# Patient Record
Sex: Male | Born: 1955 | Race: White | Hispanic: No | Smoking: Never smoker
Health system: Southern US, Community
[De-identification: ages and names within clinical notes are randomized; demographics above are authoritative.]

## PROBLEM LIST (undated history)

## (undated) DIAGNOSIS — M1990 Unspecified osteoarthritis, unspecified site: Secondary | ICD-10-CM

## (undated) DIAGNOSIS — Z789 Other specified health status: Secondary | ICD-10-CM

## (undated) DIAGNOSIS — D751 Secondary polycythemia: Secondary | ICD-10-CM

## (undated) DIAGNOSIS — C801 Malignant (primary) neoplasm, unspecified: Secondary | ICD-10-CM

## (undated) HISTORY — PX: KNEE ARTHROSCOPY: SHX127

## (undated) HISTORY — PX: GREAT TOE ARTHRODESIS, INTERPHALANGEAL JOINT: SUR55

---

## 1986-05-04 HISTORY — PX: SHOULDER ARTHROSCOPY: SHX128

## 1995-05-05 HISTORY — PX: LUMBAR LAMINECTOMY: SHX95

## 2004-05-04 HISTORY — PX: APPENDECTOMY: SHX54

## 2004-06-23 ENCOUNTER — Observation Stay (HOSPITAL_COMMUNITY): Admission: EM | Admit: 2004-06-23 | Discharge: 2004-06-24 | Payer: Self-pay | Admitting: Emergency Medicine

## 2004-06-23 ENCOUNTER — Encounter (INDEPENDENT_AMBULATORY_CARE_PROVIDER_SITE_OTHER): Payer: Self-pay | Admitting: *Deleted

## 2006-05-04 HISTORY — PX: ABSCESS DRAINAGE: SHX1119

## 2006-07-29 ENCOUNTER — Ambulatory Visit (HOSPITAL_COMMUNITY): Admission: RE | Admit: 2006-07-29 | Discharge: 2006-07-29 | Payer: Self-pay | Admitting: Cardiology

## 2007-09-30 ENCOUNTER — Inpatient Hospital Stay (HOSPITAL_COMMUNITY): Admission: EM | Admit: 2007-09-30 | Discharge: 2007-10-02 | Payer: Self-pay | Admitting: Emergency Medicine

## 2008-09-23 ENCOUNTER — Encounter: Admission: RE | Admit: 2008-09-23 | Discharge: 2008-09-23 | Payer: Self-pay | Admitting: Sports Medicine

## 2010-08-21 ENCOUNTER — Institutional Professional Consult (permissible substitution): Payer: Self-pay | Admitting: Cardiology

## 2010-09-16 NOTE — H&P (Signed)
NAMEDENALI, BECVAR            ACCOUNT NO.:  [id number]   MEDICAL RECORD NO.:  [id number]          PATIENT TYPE:  INP   LOCATION:  1537                         FACILITY:  [facility]   PHYSICIAN:  Kari Baars, M.D.  DATE OF BIRTH:  July 30, 1955   DATE OF ADMISSION:  09/30/2007  DATE OF DISCHARGE:                              HISTORY & PHYSICAL   CHIEF COMPLAINT:  Right buttocks pain and redness.   HISTORY OF PRESENT ILLNESS:  Brad Davis is a 55 year old white male,  competitive bodybuilder on chronic testosterone injections, who  presented to the emergency department with complaint of right buttocks  pain and redness.  He reports that he gave his typical testosterone dose  IM on Sunday evening.  He typically takes this every other day for the  past multiple years.  The following day he noticed increased warmth and  tenderness over the injection site.  He went to Urgent Care on the next  day and was given Rocephin IM and Avelox.Marland Kitchen  He experienced no  improvement, so he returned to the Urgent Care today and was referred to  the emergency department for evaluation of right buttocks cellulitis.   CT was performed here and was negative for abscess.  He denies any  systemic symptoms including fevers, chills or sweats.  He has not had  any prior infections.  No known MRSA exposure or prior infection.  He  requires admission for IV antibiotics.   PAST MEDICAL HISTORY AND SURGICAL HISTORY:  1. Status post appendectomy (February 2006).  2. Status post left shoulder surgery.  3. Status post knee surgery.  4. Status post L4-S1 lumbar laminectomy.  5. Status post foot surgery.   CURRENT MEDICATIONS:  1. Testosterone propionate IM every other day for many years.  2. Deca 200 daily.  3. Trenbolone 300 mg daily.  4. A-50 daily.   ALLERGIES:  NO KNOWN DRUG ALLERGIES.   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  He was Mr.  [employer] in 2005.  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  No tobacco, alcohol or drug use.   FAMILY HISTORY:  Mother and father are both healthy.  He had a sister  with leukemia, who was in remission.   REVIEW OF SYSTEMS:  All systems reviewed with the patient and are  negative except as in HPI.   PHYSICAL EXAMINATION:  Temperature 98.8, blood pressure 133/72, pulse  72, respirations 18, oxygen saturation 95% on room air.  GENERAL:  Extremely muscular gentleman with thickened skin.  Overly tan.  HEENT:  Oropharynx was moist.  NECK:  Supple without lymphadenopathy, JVD or carotid bruits.  HEART:  Regular rate and rhythm without murmurs, rubs or gallops.  LUNGS: Clear to auscultation bilaterally.  ABDOMEN:  Soft, nondistended, nontender with normal active bowel sounds.  EXTREMITIES:  He has a right gluteal/iliac circumscribed area of warmth,  tenderness and erythema.  It is swollen, but there is no fluctuance  or  purulence.  Minimally tender.  No peripheral stigmata of endocarditis.   LABS:  CBC:  White count 8.2, hemoglobin 15.9, platelets 273.  BMET:  Sodium 140, potassium 3.7, chloride 102, BUN 34, creatinine 1.2, glucose  104.   CT of the pelvis shows a right lateral buttocks cellulitis and myositis  without abscess.   ASSESSMENT AND PLAN:  Right Buttocks Cellulitis:  This appears to be cellulitis resistant to  oral antibiotics.  Thus far he has failed outpatient treatment with  p.o. antibiotics.  He is at high risk for community acquired MRSA, due  to his gym him exposures and repeated IM injections.  He will be  admitted for IV antibiotics with vancomycin and Zosyn.  PICC line will  be placed for home IV antibiotics for 7-10 days.  He should be monitored  for at least 36-48 hours to make sure that his cellulitis is improving.  I have a fairly low suspicion for  complicated skin infection such as  necrotizing fasciitis, as his cellulitis has not significantly worsened  and is not exquisitely tender.  He also does not have any systemic  symptoms.  However, will monitor for any signs of progression.  I did  discuss with them the risk of recurrent infections with his injections,  and long-term issues related to testosterone treatment.  He is very  committed to his pursuit of the national title again, and is very  worried about this PICC line and antibiotics may delay his training.   DISPOSITION:  Anticipate discharge on Saturday, if he is improving, with  IV antibiotics through Home Health.  We will go ahead and arrange for  this pending his clinical improvement.      Kari Baars, M.D.  Electronically Signed     WS/MEDQ  D:  09/30/2007  T:  09/30/2007  Job:  [id number]   cc:   Gwen Pounds, MD  Fax: [phone]

## 2010-09-19 NOTE — Op Note (Signed)
NAMETARRY, FOUNTAIN            ACCOUNT NO.:  [id number]   MEDICAL RECORD NO.:  [id number]          PATIENT TYPE:  INP   LOCATION:  0105                         [facility]ACILITY:  [facility]   PHYSICIAN:  Thornton Park. Daphine Deutscher, MD  DATE OF BIRTH:  April 30, 1956   DATE OF PROCEDURE:  06/23/2004  DATE OF DISCHARGE:                                 OPERATIVE REPORT   PREOPERATIVE DIAGNOSIS:  Acute appendicitis.   POSTOPERATIVE DIAGNOSIS:  Acute perforated appendicitis with peritonitis.   SURGEON:  Thornton Park. Daphine Deutscher, MD   ANESTHESIA:  General endotracheal.   COMPLICATIONS:  None apparent.   DRAINS:  None.   ESTIMATED BLOOD LOSS:  25 cc.   DESCRIPTION OF PROCEDURE:  Patient was taken to room #1 and given general  anesthesia.  The abdomen was prepped widely with Betadine and draped  sterilely.  A midline longitudinal incision done on the umbilicus and  entered through a small umbilical hernia.  Upon doing that, I medially  encountered yellow, creamy pus, which was cultured for anaerobes and  aerobes.  A Hasson cannula was inserted, and a 5 mm port using the auto-  suture placed in the right upper quadrant.  A 10-11 in the left lower  quadrant was used.  Purulent peritonitis was noted throughout.  I then went  down and dissected a very stuck-in appendix, where it appeared to have  ruptured near the base; however, I was able to get around the base up stream  from the perforation near the base of the cecum, and then I used the auto-  suture stapler using a vascular load.  Stapled across the cecum to that  location.  The appendix was then elevated, and then the mesentery was then  transected with a harmonic scalpel.   This was then put in a bag and then brought out through the umbilicus.  Three liters of saline were then used to irrigate the abdomen, and most of  this was withdrawn.  I reinspected the appendiceal region, and again, no  bleeding was noted.  The stump was intact, and everything appeared  to be in  order.  Most all of the effluent was withdrawn.  The patient seemed to  tolerate the procedure well, despite the fact that this was perforated.  It  had been perforated for an indefinite period of time.  The umbilical port  was then repaired with a 0 Vicryl, simple, and a second suture that I had  placed at the beginning of the procedure, and these were tied down.  The  abdomen was deflated.  The wounds were closed with 4-0 Vicryl with Benzoin  and Steri-Strips.  The patient seemed to tolerate the procedure well and was  taken to the recovery room in satisfactory condition.    MBM/MEDQ  D:  06/23/2004  T:  06/23/2004  Job:  [id number]

## 2010-09-19 NOTE — H&P (Signed)
NAMESHAIL, URBAS NO.:  [id number]   MEDICAL RECORD NO.:  [id number]          PATIENT TYPE:  EMS   LOCATION:  ED                           FACILITY:  [facility]   PHYSICIAN:  Thornton Park. Daphine Deutscher, MD  DATE OF BIRTH:  04/23/1956   DATE OF ADMISSION:  06/23/2004  DATE OF DISCHARGE:                                HISTORY & PHYSICAL   CHIEF COMPLAINT:  Abdominal pain, appendicitis.   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  He was seen by Dr. Carleene Cooper and a CT scan was ordered which  demonstrated acute appendicitis.   PAST MEDICAL HISTORY:  The patient has no known allergies.  He denies any  medical problems.   SOCIAL HISTORY:  Denies drug use, alcohol use, is a nonsmoker and lives  alone.   REVIEW OF SYMPTOMS:  Negative in terms of asking him about antibiotic  steroids.  He denies any history of migraines, asthma, heart and lung  problems, GI problems, bleeding, peripheral to his extremities including  DVT.   PHYSICAL EXAMINATION:  GENERAL:  Reveals a very muscular man who is  obviously in some distress.  HEENT:  Unremarkable.  NECK:  Supple without adenopathy or bruits.  CHEST:  Clear.  HEART:  Sinus rhythm without murmurs or gallops.  ABDOMEN:  He is tender at McBurney's point with no generalized peritonitis.  EXTREMITIES:  Full range of motion.   LABORATORY DATA:  White count 17,600 with a hemoglobin of 16.7. Electrolytes  are all within normal limits except for a glucose of 109 and a BUN of 25.  Creatinine is 1.2.  Urinalysis is negative.   IMPRESSION:  Acute appendicitis. I saw the patient in the ED and described  laparoscopic as well as open appendectomy with him in some detail including  complications not limited to bleeding, abscess formation. HE is ready to go  ahead and have this done and we are in the process of giving him Unasyn and  he will be brought to the [facility] OR for a laparoscopic/open  appendectomy.      MBM/MEDQ  D:  06/23/2004  T:  06/23/2004  Job:  [id number]   cc:   [facility]

## 2011-01-28 LAB — BASIC METABOLIC PANEL
BUN: 33 — ABNORMAL HIGH
BUN: 36 — ABNORMAL HIGH
BUN: 40 — ABNORMAL HIGH
CO2: 28
CO2: 28
CO2: 30
Calcium: 9.3
Calcium: 9.4
Calcium: 9.8
Chloride: 102
Chloride: 102
Chloride: 102
Creatinine, Ser: 1
Creatinine, Ser: 1.09
Creatinine, Ser: 1.28
GFR calc non Af Amer: >60
GFR calc non Af Amer: >60
Glucose, Bld: 84
Glucose, Bld: 88
Glucose, Bld: 91
Potassium: 3.8
Potassium: 4.1
Sodium: 141

## 2011-01-28 LAB — CBC
HCT: 44.7
HCT: 47.1
Hemoglobin: 15.9
Hemoglobin: 16.3
MCHC: 35.4
MCV: 83
RBC: 5.37
RBC: 5.46
RBC: 5.68

## 2011-01-28 LAB — POCT I-STAT, CHEM 8
BUN: 34 — ABNORMAL HIGH
Calcium, Ion: 1.26
Creatinine, Ser: 1.2
Hemoglobin: 17
Potassium: 3.7
Sodium: 140
TCO2: 28

## 2011-01-28 LAB — DIFFERENTIAL
Basophils Relative: 0
Eosinophils Absolute: 0.1
Eosinophils Relative: 2
Lymphocytes Relative: 17
Lymphs Abs: 1.4

## 2011-01-28 LAB — CULTURE, BLOOD (ROUTINE X 2): Culture: NO GROWTH

## 2012-10-17 ENCOUNTER — Encounter (HOSPITAL_BASED_OUTPATIENT_CLINIC_OR_DEPARTMENT_OTHER): Payer: Self-pay | Admitting: *Deleted

## 2012-10-17 NOTE — Progress Notes (Signed)
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 Multiple ortho surgeries

## 2012-10-19 ENCOUNTER — Other Ambulatory Visit: Payer: Self-pay | Admitting: Orthopedic Surgery

## 2012-10-19 NOTE — H&P (Signed)
Brad Davis/[facility] [address]   [address] [place], [place] [address] [phone] A Division of [facility]  Brad Davis, M.D.   Brad Davis Hole, M.D.   Brad Davis, M.D.   Brad Davis, M.D.   Brad Davis, M.D Brad Davis, M.D.  Brad Davis, M.D.   Brad Davis, M.D.   Brad Davis, M.D. Brad Davis. Brad Dienes, PA-C            Brad A. 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 On May 26th he was doing light weight curls, 30 pounds, and felt a popping sensation in his proximal biceps. He notes that he has since developed shoulder pain with any kind of elbow flexion. He denies any radiating pain, weakness or numbness. He does note he has developed ecchymosis in his anterior biceps. He has never had an injury like this before and feels well otherwise. He is able to continue working. Past Medical History:   Otherwise significant for a left shoulder distal clavicle excision performed about 20 years ago. Family History:   Coronary artery disease. Social History:   Nonsmoker. Self-employed. Pharmacist, community. Review of Systems:   See HPI. No allergies.    EXAM: Height 5'9", weight 225 pounds. Blood pressure 120/79. Extremely muscular appearing man in no acute distress. Left shoulder:  well developed musculature. Mildly tender over the bicipital groove. Nontender over the muscle belly of the biceps tendon itself. Pain with resisted biceps flexion. No Popeye sign compared to the contralateral side. Full shoulder range of motion with negative impingement testing. Capillary refill and sensation are intact distally. Further exam is normal and documented in the paper chart.    X-RAYS: A 3-view shoulder shows evidence of an old distal clavicle excision with  massively hypertrophied distal clavicle bone. No DJD present. Limited musculoskeletal ultrasound of the left shoulder technically limited due to extreme musculature. The biceps tendon groove is intact. There appears to be tendinosis structures seen within the groove; however, this is somewhat distorted. Possible partial thickness tear. Linear fibers seen on longitudinal view. Doubtful for full thickness tear. Likely partial biceps tendon tear.   IMPRESSION: Left shoulder probable partial proximal biceps tendon tear.   PLAN: Discussed options. Patient is not able to complete his normal activities because of shoulder pain. He has pain with elbow flexion. Even light weight lifting such as would be normal for activities of daily living is uncomfortable. He likely has a partial biceps tendon tear; however, this is a limited study based on his body habitus. We will obtain an MRI and patient will follow up after the MRI to discuss options and what he can and can't do. Patient expresses understanding and agreement.     Brad Davis, M.D. Electronically verified by Brad Davis, M.D. DFM(EC):tal D 10-04-12, T 10-05-12 CC:  Dr. Creola Davis (fax [phone])  Brad Davis/[facility] [address]   [address] [place], [place] [address] [phone] A Division of [facility]  Brad Davis, M.D.   Brad Davis Hole, M.D.   Brad Davis, M.D.   Brad Davis, M.D.   Brad Davis, M.D Brad Davis, M.D.  Brad Davis, M.D.   Brad Davis, M.D.   Brad Davis, M.D.  Brad Davis. Brad Dienes, PA-C            Brad A. Shepperson, PA-C Brad Mershon, PA-C [place], [place]   RE: Brad, Davis                                [id number]      DOB: 1955/11/28 PROGRESS NOTE: 10-07-12 Brad Davis comes in for follow up.  MRI complete and I have reviewed the report, as well as the scan itself and discussed it with him at length.  He has had some  improvement of his distal symptoms over the biceps muscle, but continues to have significant dysfunction within the shoulder itself.  MRI shows severe tendinopathy long head biceps tendon intraarticularly with longitudinal split tearing.  There is also edema and injury at the musculotendinous junction from the biceps.  That is the area that is improving clinically.  Rotator cuff is intact.  Some mild deformity posterior labrum and posterior glenohumeral joint, not too extreme.  Previous AC decompression with marked hypertrophy and bony overgrowth there and despite how large that looks on x-ray and MRI he is really not having a lot of symptoms there.  The partially encapsulated collection within the posterior deltoid is consistent with intramuscular injections.    DISPOSITION:  I had a long thorough discussion with Brad Davis, 25 minutes face-to-face, concerning the situation.  I have given him the option of further time to see if we get enough improvement of his symptoms letting the musculotendinous injury heal.  Even after that however I think he is going to have continued symptoms from the proximal biceps changes.  The impact he is having on his shoulder with his weightlifting is evident and in the future is going to have an impact not only on his shoulder, but numerous body areas.  He is accepting that impact and there is nothing I am going to do that is going to change that.  His preference now is to try to address the biceps tendon, as he knows that is going to continue to be a problem.  We have discussed exam under anesthesia, arthroscopy with release and excision of intraarticular portion of biceps tendon.  I am going to allow this to recess into the bicipital groove.  We have talked about the pros and cons of extraarticular tenodesis and we are not going to do that, which is my preference.  We will look subacromially adding any further decompression, distal clavicle excision, as indicated based on findings.  I  am not anticipating having to do anything with his cuff, but I will do an acromioplasty.  I told him after I release the biceps a good six weeks of rest, although he can come out of his sling in a couple of days.  I have told him if he gets too aggressive too early he is going to pull the tendon out and end up with more of a cosmetic deformity.  All questions answered.  Procedure, risks, benefits and complications reviewed in detail.  I will see him at the time of operative intervention.    Brad Davis, M.D.   Electronically verified by Brad Davis, M.D. DFM:jjh D 10-07-12 T 10-10-12

## 2012-10-20 ENCOUNTER — Encounter (HOSPITAL_BASED_OUTPATIENT_CLINIC_OR_DEPARTMENT_OTHER): Payer: Self-pay | Admitting: Certified Registered"

## 2012-10-20 ENCOUNTER — Ambulatory Visit (HOSPITAL_BASED_OUTPATIENT_CLINIC_OR_DEPARTMENT_OTHER): Payer: Managed Care, Other (non HMO) | Admitting: Certified Registered"

## 2012-10-20 ENCOUNTER — Encounter (HOSPITAL_BASED_OUTPATIENT_CLINIC_OR_DEPARTMENT_OTHER): Payer: Self-pay

## 2012-10-20 ENCOUNTER — Ambulatory Visit (HOSPITAL_BASED_OUTPATIENT_CLINIC_OR_DEPARTMENT_OTHER)
Admission: RE | Admit: 2012-10-20 | Discharge: 2012-10-20 | Disposition: A | Discharge status: Home / Self Care | Payer: Managed Care, Other (non HMO) | Source: Ambulatory Visit | Attending: Orthopedic Surgery | Admitting: Orthopedic Surgery

## 2012-10-20 ENCOUNTER — Encounter (HOSPITAL_BASED_OUTPATIENT_CLINIC_OR_DEPARTMENT_OTHER)
Admission: RE | Disposition: A | Discharge status: Home / Self Care | Payer: Self-pay | Source: Ambulatory Visit | Attending: Orthopedic Surgery

## 2012-10-20 DIAGNOSIS — M948X9 Other specified disorders of cartilage, unspecified sites: Secondary | ICD-10-CM | POA: Insufficient documentation

## 2012-10-20 DIAGNOSIS — M67919 Unspecified disorder of synovium and tendon, unspecified shoulder: Secondary | ICD-10-CM | POA: Insufficient documentation

## 2012-10-20 DIAGNOSIS — M719 Bursopathy, unspecified: Secondary | ICD-10-CM | POA: Insufficient documentation

## 2012-10-20 DIAGNOSIS — M19019 Primary osteoarthritis, unspecified shoulder: Secondary | ICD-10-CM | POA: Insufficient documentation

## 2012-10-20 HISTORY — DX: Other specified health status: Z78.9

## 2012-10-20 HISTORY — PX: SHOULDER ARTHROSCOPY WITH ROTATOR CUFF REPAIR AND SUBACROMIAL DECOMPRESSION: SHX5686

## 2012-10-20 HISTORY — DX: Unspecified osteoarthritis, unspecified site: M19.90

## 2012-10-20 SURGERY — SHOULDER ARTHROSCOPY WITH ROTATOR CUFF REPAIR AND SUBACROMIAL DECOMPRESSION
Anesthesia: General | Site: Shoulder | Laterality: Left | Wound class: Clean

## 2012-10-20 MED ORDER — LACTATED RINGERS IV SOLN
INTRAVENOUS | Status: DC
  Administered 2012-10-20: 13:00:00 via INTRAVENOUS

## 2012-10-20 MED ORDER — OXYCODONE HCL 5 MG/5ML PO SOLN: 5.0000 mg | Freq: Once | ORAL | Status: DC | PRN

## 2012-10-20 MED ORDER — METOCLOPRAMIDE HCL 5 MG PO TABS: 5.0000 mg | ORAL_TABLET | Freq: Three times a day (TID) | ORAL | Status: DC | PRN

## 2012-10-20 MED ORDER — OXYCODONE-ACETAMINOPHEN 5-325 MG PO TABS: 1.0000 | ORAL_TABLET | ORAL | Status: DC | PRN

## 2012-10-20 MED ORDER — SODIUM CHLORIDE 0.9 % IR SOLN
Status: DC | PRN
  Administered 2012-10-20: 3000 mL

## 2012-10-20 MED ORDER — BUPIVACAINE-EPINEPHRINE PF 0.5-1:200000 % IJ SOLN
INTRAMUSCULAR | Status: DC | PRN
  Administered 2012-10-20: 30 mL

## 2012-10-20 MED ORDER — HYDROMORPHONE HCL PF 1 MG/ML IJ SOLN: 0.2500 mg | INTRAMUSCULAR | Status: DC | PRN

## 2012-10-20 MED ORDER — FENTANYL CITRATE 0.05 MG/ML IJ SOLN
50.0000 ug | INTRAMUSCULAR | Status: DC | PRN
  Administered 2012-10-20: 100 ug via INTRAVENOUS

## 2012-10-20 MED ORDER — OXYCODONE HCL 5 MG PO TABS: 5.0000 mg | ORAL_TABLET | Freq: Once | ORAL | Status: DC | PRN

## 2012-10-20 MED ORDER — METOCLOPRAMIDE HCL 5 MG/ML IJ SOLN: 5.0000 mg | Freq: Three times a day (TID) | INTRAMUSCULAR | Status: DC | PRN

## 2012-10-20 MED ORDER — ONDANSETRON HCL 4 MG PO TABS: 4.0000 mg | ORAL_TABLET | Freq: Four times a day (QID) | ORAL | Status: DC | PRN

## 2012-10-20 MED ORDER — LACTATED RINGERS IV SOLN
INTRAVENOUS | Status: DC
  Administered 2012-10-20 (×2): via INTRAVENOUS

## 2012-10-20 MED ORDER — ONDANSETRON HCL 4 MG/2ML IJ SOLN: 4.0000 mg | Freq: Four times a day (QID) | INTRAMUSCULAR | Status: DC | PRN

## 2012-10-20 MED ORDER — CEFAZOLIN SODIUM-DEXTROSE 2-3 GM-% IV SOLR: 2.0000 g | INTRAVENOUS | Status: DC

## 2012-10-20 MED ORDER — CHLORHEXIDINE GLUCONATE 4 % EX LIQD: 60.0000 mL | Freq: Once | CUTANEOUS | Status: DC

## 2012-10-20 MED ORDER — LIDOCAINE HCL (CARDIAC) 20 MG/ML IV SOLN
INTRAVENOUS | Status: DC | PRN
  Administered 2012-10-20: 40 mg via INTRAVENOUS

## 2012-10-20 MED ORDER — PROPOFOL 10 MG/ML IV BOLUS
INTRAVENOUS | Status: DC | PRN
  Administered 2012-10-20: 300 mg via INTRAVENOUS

## 2012-10-20 MED ORDER — SUCCINYLCHOLINE CHLORIDE 20 MG/ML IJ SOLN
INTRAMUSCULAR | Status: DC | PRN
  Administered 2012-10-20: 100 mg via INTRAVENOUS

## 2012-10-20 MED ORDER — ONDANSETRON HCL 4 MG/2ML IJ SOLN
INTRAMUSCULAR | Status: DC | PRN
  Administered 2012-10-20: 4 mg via INTRAVENOUS

## 2012-10-20 MED ORDER — MIDAZOLAM HCL 2 MG/2ML IJ SOLN
1.0000 mg | INTRAMUSCULAR | Status: DC | PRN
  Administered 2012-10-20: 4 mg via INTRAVENOUS

## 2012-10-20 SURGICAL SUPPLY — 72 items
APL SKNCLS STERI-STRIP NONHPOA (GAUZE/BANDAGES/DRESSINGS)
BENZOIN TINCTURE PRP APPL 2/3 (GAUZE/BANDAGES/DRESSINGS) IMPLANT
BLADE CUTTER GATOR 3.5 (BLADE) ×2 IMPLANT
BLADE CUTTER MENIS 5.5 (BLADE) IMPLANT
BLADE GREAT WHITE 4.2 (BLADE) ×2 IMPLANT
BLADE SURG 15 STRL LF DISP TIS (BLADE) IMPLANT
BLADE SURG 15 STRL SS (BLADE)
BUR OVAL 6.0 (BURR) ×2 IMPLANT
CANISTER OMNI JUG 16 LITER (MISCELLANEOUS) ×2 IMPLANT
CANISTER SUCTION 2500CC (MISCELLANEOUS) IMPLANT
CANNULA DRY DOC 8X75 (CANNULA) IMPLANT
CANNULA TWIST IN 8.25X7CM (CANNULA) IMPLANT
CLOTH BEACON ORANGE TIMEOUT ST (SAFETY) ×2 IMPLANT
DECANTER SPIKE VIAL GLASS SM (MISCELLANEOUS) IMPLANT
DRAPE OEC MINIVIEW 54X84 (DRAPES) IMPLANT
DRAPE STERI 35X30 U-POUCH (DRAPES) ×2 IMPLANT
DRAPE U-SHAPE 47X51 STRL (DRAPES) ×2 IMPLANT
DRAPE U-SHAPE 76X120 STRL (DRAPES) ×4 IMPLANT
DRSG PAD ABDOMINAL 8X10 ST (GAUZE/BANDAGES/DRESSINGS) ×2 IMPLANT
DURAPREP 26ML APPLICATOR (WOUND CARE) ×2 IMPLANT
ELECT MENISCUS 165MM 90D (ELECTRODE) ×2 IMPLANT
ELECT NDL TIP 2.8 STRL (NEEDLE) IMPLANT
ELECT NEEDLE TIP 2.8 STRL (NEEDLE) IMPLANT
ELECT REM PT RETURN 9FT ADLT (ELECTROSURGICAL) ×2
ELECTRODE REM PT RTRN 9FT ADLT (ELECTROSURGICAL) ×1 IMPLANT
GAUZE XEROFORM 1X8 LF (GAUZE/BANDAGES/DRESSINGS) ×2 IMPLANT
GLOVE BIO SURGEON STRL SZ 6.5 (GLOVE) ×1 IMPLANT
GLOVE BIOGEL PI IND STRL 7.0 (GLOVE) IMPLANT
GLOVE BIOGEL PI INDICATOR 7.0 (GLOVE) ×1
GLOVE ORTHO TXT STRL SZ7.5 (GLOVE) ×2 IMPLANT
GOWN PREVENTION PLUS XLARGE (GOWN DISPOSABLE) ×2 IMPLANT
GOWN STRL REIN 2XL XLG LVL4 (GOWN DISPOSABLE) ×3 IMPLANT
IV NS IRRIG 3000ML ARTHROMATIC (IV SOLUTION) ×8 IMPLANT
NDL SCORPION MULTI FIRE (NEEDLE) IMPLANT
NDL SUT 6 .5 CRC .975X.05 MAYO (NEEDLE) IMPLANT
NEEDLE MAYO TAPER (NEEDLE)
NEEDLE SCORPION MULTI FIRE (NEEDLE) IMPLANT
NS IRRIG 1000ML POUR BTL (IV SOLUTION) IMPLANT
PACK ARTHROSCOPY DSU (CUSTOM PROCEDURE TRAY) ×2 IMPLANT
PACK BASIN DAY SURGERY FS (CUSTOM PROCEDURE TRAY) ×2 IMPLANT
PASSER SUT SWANSON 36MM LOOP (INSTRUMENTS) IMPLANT
PENCIL BUTTON HOLSTER BLD 10FT (ELECTRODE) ×2 IMPLANT
SET ARTHROSCOPY TUBING (MISCELLANEOUS) ×2
SET ARTHROSCOPY TUBING LN (MISCELLANEOUS) ×1 IMPLANT
SLEEVE SCD COMPRESS KNEE MED (MISCELLANEOUS) ×1 IMPLANT
SLING ARM FOAM STRAP LRG (SOFTGOODS) ×1 IMPLANT
SLING ARM FOAM STRAP MED (SOFTGOODS) IMPLANT
SLING ARM FOAM STRAP XLG (SOFTGOODS) IMPLANT
SLING ARM IMMOBILIZER LRG (SOFTGOODS) IMPLANT
SLING ARM IMMOBILIZER MED (SOFTGOODS) IMPLANT
SPONGE GAUZE 4X4 12PLY (GAUZE/BANDAGES/DRESSINGS) ×4 IMPLANT
SPONGE LAP 4X18 X RAY DECT (DISPOSABLE) IMPLANT
STRIP CLOSURE SKIN 1/2X4 (GAUZE/BANDAGES/DRESSINGS) IMPLANT
SUCTION FRAZIER TIP 10 FR DISP (SUCTIONS) IMPLANT
SUT ETHIBOND 2 OS 4 DA (SUTURE) IMPLANT
SUT ETHILON 2 0 FS 18 (SUTURE) IMPLANT
SUT ETHILON 3 0 PS 1 (SUTURE) ×1 IMPLANT
SUT FIBERWIRE #2 38 T-5 BLUE (SUTURE)
SUT RETRIEVER MED (INSTRUMENTS) IMPLANT
SUT STEEL 4 (SUTURE) IMPLANT
SUT STEEL 5 (SUTURE) IMPLANT
SUT TIGER TAPE 7 IN WHITE (SUTURE) IMPLANT
SUT VIC AB 0 CT1 27 (SUTURE)
SUT VIC AB 0 CT1 27XBRD ANBCTR (SUTURE) IMPLANT
SUT VIC AB 2-0 SH 27 (SUTURE)
SUT VIC AB 2-0 SH 27XBRD (SUTURE) IMPLANT
SUT VIC AB 3-0 FS2 27 (SUTURE) IMPLANT
SUTURE FIBERWR #2 38 T-5 BLUE (SUTURE) IMPLANT
TAPE FIBER 2MM 7IN #2 BLUE (SUTURE) IMPLANT
TOWEL OR 17X24 6PK STRL BLUE (TOWEL DISPOSABLE) ×2 IMPLANT
WATER STERILE IRR 1000ML POUR (IV SOLUTION) ×2 IMPLANT
YANKAUER SUCT BULB TIP NO VENT (SUCTIONS) IMPLANT

## 2012-10-20 NOTE — Transfer of Care (Signed)
Immediate Anesthesia Transfer of Care Note  Patient: Chima Astorino Geddis  Procedure(s) Performed: Procedure(s): LEFT SHOULDER ARTHROSCOPY , DISTAL CLAVICULECTOMY, EXTENSIVE DEBRIDEMENT, WITH SUBACROMIAL DECOMPRESSION, DEBRIDE LABRUM ROTATOR CUFF (Left)  Patient Location: PACU  Anesthesia Type:GA combined with regional for post-op pain  Level of Consciousness: sedated  Airway & Oxygen Therapy: Patient Spontanous Breathing and Patient connected to face mask oxygen  Post-op Assessment: Report given to PACU RN and Post -op Vital signs reviewed and stable  Post vital signs: Reviewed and stable  Complications: No apparent anesthesia complications

## 2012-10-20 NOTE — Anesthesia Preprocedure Evaluation (Signed)
Anesthesia Evaluation  Patient identified by MRN, date of birth, ID band Patient awake    Reviewed: Allergy & Precautions, H&P , NPO status , Patient's Chart, lab work & pertinent test results  Airway Mallampati: I TM Distance: >3 FB Neck ROM: full    Dental  (+) Teeth Intact and Dental Advidsory Given   Pulmonary neg pulmonary ROS,    Pulmonary exam normal       Cardiovascular negative cardio ROS      Neuro/Psych negative neurological ROS     GI/Hepatic negative GI ROS, Neg liver ROS,   Endo/Other  negative endocrine ROS  Renal/GU negative Renal ROS  negative genitourinary   Musculoskeletal   Abdominal Normal abdominal exam  (+)   Peds  Hematology negative hematology ROS (+)   Anesthesia Other Findings   Reproductive/Obstetrics negative OB ROS                           Anesthesia Physical Anesthesia Plan  ASA: I  Anesthesia Plan: General   Post-op Pain Management:    Induction: Intravenous  Airway Management Planned: Oral ETT  Additional Equipment:   Intra-op Plan:   Post-operative Plan: Extubation in OR  Informed Consent: I have reviewed the patients History and Physical, chart, labs and discussed the procedure including the risks, benefits and alternatives for the proposed anesthesia with the patient or authorized representative who has indicated his/her understanding and acceptance.   Dental advisory given  Plan Discussed with: CRNA, Anesthesiologist and Surgeon  Anesthesia Plan Comments:         Anesthesia Quick Evaluation

## 2012-10-20 NOTE — Progress Notes (Signed)
Assisted Dr. Fitzgerald with left, ultrasound guided, interscalene  block. Side rails up, monitors on throughout procedure. See vital signs in flow sheet. Tolerated Procedure well. 

## 2012-10-20 NOTE — Anesthesia Postprocedure Evaluation (Signed)
Anesthesia Post Note  Patient: Brad Davis  Procedure(s) Performed: Procedure(s) (LRB): LEFT SHOULDER ARTHROSCOPY , DISTAL CLAVICULECTOMY, EXTENSIVE DEBRIDEMENT, WITH SUBACROMIAL DECOMPRESSION, DEBRIDE LABRUM ROTATOR CUFF (Left)  Anesthesia type: General  Patient location: PACU  Post pain: Pain level controlled and Adequate analgesia  Post assessment: Post-op Vital signs reviewed, Patient's Cardiovascular Status Stable, Respiratory Function Stable, Patent Airway and Pain level controlled  Last Vitals:  Filed Vitals:   10/20/12 1500  BP: 119/73  Pulse: 101  Temp:   Resp: 25    Post vital signs: Reviewed and stable  Level of consciousness: awake, alert  and oriented  Complications: No apparent anesthesia complications

## 2012-10-20 NOTE — Anesthesia Procedure Notes (Addendum)
Anesthesia Regional Block:  Interscalene brachial plexus block  Pre-Anesthetic Checklist: ,, timeout performed, Correct Patient, Correct Site, Correct Laterality, Correct Procedure, Correct Position, site marked, Risks and benefits discussed, pre-op evaluation,  At surgeon's request and post-op pain management  Laterality: Left  Prep: Maximum Sterile Barrier Precautions used and chloraprep       Needles:  Injection technique: Single-shot  Needle Type: Echogenic Stimulator Needle     Needle Length: 5cm 5 cm Needle Gauge: 22 and 22 G    Additional Needles:  Procedures: ultrasound guided (picture in chart) and nerve stimulator Interscalene brachial plexus block  Nerve Stimulator or Paresthesia:  Response: Biceps response, 0.4 mA,   Additional Responses:   Narrative:  Start time: 10/20/2012 12:58 PM End time: 10/20/2012 1:09 PM Injection made incrementally with aspirations every 5 mL. Anesthesiologist: Sampson Goon, MD  Additional Notes: 2% Lidocaine skin wheel.   Interscalene brachial plexus block Procedure Name: Intubation Performed by: Lance Coon Pre-anesthesia Checklist: Patient identified, Emergency Drugs available, Suction available and Patient being monitored Patient Re-evaluated:Patient Re-evaluated prior to inductionOxygen Delivery Method: Circle System Utilized Preoxygenation: Pre-oxygenation with 100% oxygen Intubation Type: IV induction Ventilation: Mask ventilation without difficulty Laryngoscope Size: Mac and 3 Grade View: Grade I Tube type: Oral Tube size: 8.0 mm Number of attempts: 1 Airway Equipment and Method: stylet and oral airway Placement Confirmation: ETT inserted through vocal cords under direct vision,  positive ETCO2 and breath sounds checked- equal and bilateral Tube secured with: Tape Dental Injury: Teeth and Oropharynx as per pre-operative assessment

## 2012-10-20 NOTE — Interval H&P Note (Signed)
History and Physical Interval Note:  10/20/2012 7:31 AM  Brad Davis  has presented today for surgery, with the diagnosis of LEFT SHOULDER DEGENERATIVE ARTHRITIS, DISORDERS OF BURSAE AND TENDONS IN SHOULDER REGION/ROTATOR CUFF  The various methods of treatment have been discussed with the patient and family. After consideration of risks, benefits and other options for treatment, the patient has consented to  Procedure(s): LEFT SHOULDER ARTHROSCOPY , DISTAL CLAVICULECTOMY, EXTENSIVE DEBRIDEMENT, WITH SUBACROMIAL DECOMPRESSION, BICEP TENODESIS (Left) as a surgical intervention .  The patient's history has been reviewed, patient examined, no change in status, stable for surgery.  I have reviewed the patient's chart and labs.  Questions were answered to the patient's satisfaction.     MURPHY,DANIEL F

## 2012-10-21 NOTE — Op Note (Signed)
NAMEMarland Kitchen  ORIS, STAFFIERI           ACCOUNT NO.:  [id number]  MEDICAL RECORD NO.:  [id number]  LOCATION:                               FACILITY:  [facility]  PHYSICIAN:  Loreta Ave, M.D. DATE OF BIRTH:  06-Jun-1955  DATE OF PROCEDURE:  10/20/2012 DATE OF DISCHARGE:  10/20/2012                              OPERATIVE REPORT   PREOPERATIVE DIAGNOSES:  Left shoulder tendinosis, tearing of long head biceps tendon.  Subacromial impingement.  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  POSTOPERATIVE DIAGNOSES:  Left shoulder impingement, partial tearing of rotator cuff above and below, not full-thickness.  Superior labrum tear. Intact biceps tendon.  Bony overgrowth of acromioclavicular joint.  PROCEDURES:  Left shoulder exam under anesthesia, arthroscopy. Debridement of labrum and assessment of biceps tendon.  Debridement of rotator cuff above and below.  Bursectomy, acromioplasty, CA ligament release.  Excision of distal clavicle.  SURGEON:  Loreta Ave, M.D.  ASSISTANT:  Janace Litten, OPA, present throughout the entire case and necessary for timely completion of procedure.  ANESTHESIA:  General.  BLOOD LOSS:  Minimal.  SPECIMENS:  None.  CULTURES:  None.  COMPLICATIONS:  None.  DRESSINGS:  Soft compressive sling.  PROCEDURE:  The patient was brought to the operating room and placed on the operating table in supine position.  After adequate anesthesia had been obtained, shoulder was examined.  Full motion, stable shoulder. Placed in beach-chair position on the shoulder positioner, prepped and draped in usual sterile fashion.  Three portals; anterior, posterior and lateral.  Arthroscope was introduced, shoulder was distended and inspected.  Surprisingly the biceps tendon looked excellent.  Good attachment to the glenoid and excellent tendon all the way through the bicipital groove.  No tearing,  no split tears, no degeneration to warren excision.  The labrum at the top debrided.  Articular cartilage looked good except for little indentation at the top of the humeral head.  Some thinning of the cuff supraspinatus about 1 cm medial to the attachment, but nothing full thickness.  Cannulas were redirected subacromially. Marked impingement and reactive bursitis.  Abrasive changes on the top of the cuff.  Bursa resected.  Cuff debrided.  Then thoroughly inspected.  Acromioplasty to a type 1 acromion releasing the CA ligament.  I then re-excised the distal clavicle and spurring in that area to create a nice interval and prevent impingement from the end of the clavicle.  Adequacy of decompression and debridement confirmed viewing from all portals.  Instruments and fluid were removed.  Portals were closed with nylon.  Sterile compressive dressing was applied. Anesthesia was reversed.  Brought to the recovery room.  Tolerated the surgery well.  No complications.     Loreta Ave, M.D.     DFM/MEDQ  D:  10/20/2012  T:  10/21/2012  Job:  [id number]

## 2012-10-24 ENCOUNTER — Encounter (HOSPITAL_BASED_OUTPATIENT_CLINIC_OR_DEPARTMENT_OTHER): Payer: Self-pay | Admitting: Orthopedic Surgery

## 2013-01-04 ENCOUNTER — Telehealth: Payer: Self-pay | Admitting: Medical Oncology

## 2013-01-04 NOTE — Telephone Encounter (Signed)
Dr Isabel Caprice sent [facility] to [facility] to have a unit of blood removed and they will not do it. Dr Isabel Caprice told him to call us. I left message for pt to return my call .  I called Dr Noralee Chars office and told RN to call [facility].

## 2013-01-09 ENCOUNTER — Other Ambulatory Visit: Payer: Self-pay | Admitting: Urology

## 2013-01-09 DIAGNOSIS — D751 Secondary polycythemia: Secondary | ICD-10-CM

## 2013-01-16 ENCOUNTER — Other Ambulatory Visit (HOSPITAL_COMMUNITY): Payer: Self-pay | Admitting: Urology

## 2013-01-16 ENCOUNTER — Ambulatory Visit (HOSPITAL_COMMUNITY)
Admission: RE | Admit: 2013-01-16 | Discharge: 2013-01-16 | Disposition: A | Discharge status: Home / Self Care | Payer: Managed Care, Other (non HMO) | Source: Ambulatory Visit | Attending: Urology | Admitting: Urology

## 2013-01-16 ENCOUNTER — Encounter (HOSPITAL_COMMUNITY): Payer: Self-pay

## 2013-01-16 DIAGNOSIS — D751 Secondary polycythemia: Secondary | ICD-10-CM | POA: Insufficient documentation

## 2013-01-16 NOTE — Procedures (Signed)
Therapeutic phlebotomy started- 1112, end-1135 . of blood withdrawn from patient.

## 2013-01-16 NOTE — Discharge Instructions (Signed)
Therapeutic Phlebotomy Therapeutic phlebotomy is the controlled removal of blood from your body for the purpose of treating a medical condition. It is similar to donating blood. Usually, about a pint (470 mL) of blood is removed. The average adult has 9 to 12 pints (4.3 to 5.7 L) of blood. Therapeutic phlebotomy may be used to treat the following medical conditions:  Hemochromatosis. This is a condition in which there is too much iron in the blood.  Polycythemia vera. This is a condition in which there are too many red cells in the blood.  Porphyria cutanea tarda. This is a disease usually passed from one generation to the next (inherited). It is a condition in which an important part of hemoglobin is not made properly. This results in the build up of abnormal amounts of porphyrins in the body.  Sickle cell disease. This is an inherited disease. It is a condition in which the red blood cells form an abnormal crescent shape rather than a round shape. LET YOUR CAREGIVER KNOW ABOUT:  Allergies.  Medicines taken including herbs, eyedrops, over-the-counter medicines, and creams.  Use of steroids (by mouth or creams).  Previous problems with anesthetics or numbing medicine.  History of blood clots.  History of bleeding or blood problems.  Previous surgery.  Possibility of pregnancy, if this applies. RISKS AND COMPLICATIONS This is a simple and safe procedure. Problems are unlikely. However, problems can occur and may include:  Nausea or lightheadedness.  Low blood pressure.  Soreness, bleeding, swelling, or bruising at the needle insertion site.  Infection. BEFORE THE PROCEDURE  This is a procedure that can be done as an outpatient. Confirm the time that you need to arrive for your procedure. Confirm whether there is a need to fast or withhold any medications. It is helpful to wear clothing with sleeves that can be raised above the elbow. A blood sample may be done to determine the  amount of red blood cells or iron in your blood. Plan ahead of time to have someone drive you home after the procedure. PROCEDURE The entire procedure from preparation through recovery takes about 1 hour. The actual collection takes about 10 to 15 minutes.  A needle will be inserted into your vein.  Tubing and a collection bag will be attached to that needle.  Blood will flow through the needle and tubing into the collection bag.  You may be asked to open and close your hand slowly and continuously during the entire collection.  Once the specified amount of blood has been removed from your body, the collection bag and tubing will be clamped.  The needle will be removed.  Pressure will be held on the site of the needle insertion to stop the bleeding. Then a bandage will be placed over the needle insertion site. AFTER THE PROCEDURE  Your recovery will be assessed and monitored. If there are no problems, as an outpatient, you should be able to go home shortly after the procedure.  Document Released: 09/22/2010 Document Revised: 07/13/2011 Document Reviewed: 09/22/2010 [employer] Patient Information 2014 [employer].  

## 2013-11-08 ENCOUNTER — Other Ambulatory Visit: Payer: Self-pay | Admitting: Urology

## 2013-11-08 DIAGNOSIS — D751 Secondary polycythemia: Secondary | ICD-10-CM

## 2013-11-21 ENCOUNTER — Encounter (HOSPITAL_COMMUNITY): Admission: RE | Admit: 2013-11-21 | Payer: Managed Care, Other (non HMO) | Source: Ambulatory Visit

## 2013-11-21 ENCOUNTER — Other Ambulatory Visit (HOSPITAL_COMMUNITY): Payer: Self-pay | Admitting: Urology

## 2013-11-24 ENCOUNTER — Encounter (HOSPITAL_COMMUNITY)
Admission: RE | Admit: 2013-11-24 | Discharge: 2013-11-24 | Disposition: A | Discharge status: Home / Self Care | Payer: Managed Care, Other (non HMO) | Source: Ambulatory Visit | Attending: Urology | Admitting: Urology

## 2013-11-24 ENCOUNTER — Encounter (HOSPITAL_COMMUNITY): Payer: Self-pay

## 2013-11-24 DIAGNOSIS — R718 Other abnormality of red blood cells: Secondary | ICD-10-CM | POA: Insufficient documentation

## 2013-11-24 NOTE — Progress Notes (Signed)
Brad Davis presents today for phlebotomy per MD orders. HGB/HCT: done at [place] office Phlebotomy procedure started at 1408 and ended at 1423 500 cc removed. Patient tolerated procedure well. IV needle removed intact.

## 2013-12-12 ENCOUNTER — Other Ambulatory Visit: Payer: Self-pay | Admitting: Physician Assistant

## 2013-12-13 ENCOUNTER — Encounter (HOSPITAL_BASED_OUTPATIENT_CLINIC_OR_DEPARTMENT_OTHER): Payer: Self-pay | Admitting: *Deleted

## 2013-12-14 ENCOUNTER — Encounter (HOSPITAL_BASED_OUTPATIENT_CLINIC_OR_DEPARTMENT_OTHER): Payer: Self-pay

## 2013-12-14 ENCOUNTER — Encounter (HOSPITAL_BASED_OUTPATIENT_CLINIC_OR_DEPARTMENT_OTHER)
Admission: RE | Disposition: A | Discharge status: Home / Self Care | Payer: Self-pay | Source: Ambulatory Visit | Attending: Orthopedic Surgery

## 2013-12-14 ENCOUNTER — Ambulatory Visit (HOSPITAL_BASED_OUTPATIENT_CLINIC_OR_DEPARTMENT_OTHER)
Admission: RE | Admit: 2013-12-14 | Discharge: 2013-12-14 | Disposition: A | Discharge status: Home / Self Care | Payer: Managed Care, Other (non HMO) | Source: Ambulatory Visit | Attending: Orthopedic Surgery | Admitting: Orthopedic Surgery

## 2013-12-14 DIAGNOSIS — M25669 Stiffness of unspecified knee, not elsewhere classified: Secondary | ICD-10-CM | POA: Diagnosis not present

## 2013-12-14 DIAGNOSIS — D1779 Benign lipomatous neoplasm of other sites: Secondary | ICD-10-CM | POA: Insufficient documentation

## 2013-12-14 HISTORY — PX: MASS EXCISION: SHX2000

## 2013-12-14 SURGERY — MINOR EXCISION OF MASS
Anesthesia: LOCAL | Site: Shoulder | Laterality: Right

## 2013-12-14 MED ORDER — LIDOCAINE-EPINEPHRINE (PF) 1 %-1:200000 IJ SOLN
INTRAMUSCULAR | Status: DC | PRN
  Administered 2013-12-14: 6 mL

## 2013-12-14 MED ORDER — OXYCODONE-ACETAMINOPHEN 5-325 MG PO TABS: 1.0000 | ORAL_TABLET | ORAL | Status: DC | PRN

## 2013-12-14 MED ORDER — LIDOCAINE-EPINEPHRINE (PF) 1 %-1:200000 IJ SOLN
INTRAMUSCULAR | Status: AC
  Filled 2013-12-14: qty 10

## 2013-12-14 MED ORDER — ONDANSETRON HCL 4 MG PO TABS: 4.0000 mg | ORAL_TABLET | Freq: Three times a day (TID) | ORAL | Status: DC | PRN

## 2013-12-14 SURGICAL SUPPLY — 35 items
APL SKNCLS STERI-STRIP NONHPOA (GAUZE/BANDAGES/DRESSINGS) ×4
BENZOIN TINCTURE PRP APPL 2/3 (GAUZE/BANDAGES/DRESSINGS) ×4 IMPLANT
BLADE SURG 15 STRL LF DISP TIS (BLADE) ×1 IMPLANT
BLADE SURG 15 STRL SS (BLADE) ×3
DRAPE PED LAPAROTOMY (DRAPES) ×2 IMPLANT
DURAPREP 26ML APPLICATOR (WOUND CARE) ×2 IMPLANT
ELECT REM PT RETURN 9FT ADLT (ELECTROSURGICAL) ×3
ELECTRODE REM PT RTRN 9FT ADLT (ELECTROSURGICAL) ×1 IMPLANT
GAUZE SPONGE 4X4 12PLY STRL (GAUZE/BANDAGES/DRESSINGS) ×2 IMPLANT
GAUZE XEROFORM 1X8 LF (GAUZE/BANDAGES/DRESSINGS) ×4 IMPLANT
GLOVE BIOGEL PI IND STRL 7.0 (GLOVE) ×2 IMPLANT
GLOVE BIOGEL PI INDICATOR 7.0 (GLOVE) ×2
GLOVE ECLIPSE 6.5 STRL STRAW (GLOVE) ×4 IMPLANT
GLOVE EXAM NITRILE LRG STRL (GLOVE) ×2 IMPLANT
GLOVE ORTHO TXT STRL SZ7.5 (GLOVE) ×2 IMPLANT
GOWN STRL REUS W/ TWL LRG LVL3 (GOWN DISPOSABLE) ×2 IMPLANT
GOWN STRL REUS W/ TWL XL LVL3 (GOWN DISPOSABLE) ×1 IMPLANT
GOWN STRL REUS W/TWL LRG LVL3 (GOWN DISPOSABLE) ×6
GOWN STRL REUS W/TWL XL LVL3 (GOWN DISPOSABLE) ×3
NDL HYPO 25X1 1.5 SAFETY (NEEDLE) IMPLANT
NEEDLE HYPO 25X1 1.5 SAFETY (NEEDLE) ×3 IMPLANT
NS IRRIG 1000ML POUR BTL (IV SOLUTION) ×4 IMPLANT
PACK BASIN DAY SURGERY FS (CUSTOM PROCEDURE TRAY) ×4 IMPLANT
PENCIL BUTTON HOLSTER BLD 10FT (ELECTRODE) ×2 IMPLANT
SPONGE GAUZE 4X4 12PLY STER LF (GAUZE/BANDAGES/DRESSINGS) ×2 IMPLANT
SPONGE LAP 4X18 X RAY DECT (DISPOSABLE) ×2 IMPLANT
STRIP CLOSURE SKIN 1/2X4 (GAUZE/BANDAGES/DRESSINGS) ×2 IMPLANT
SUT MNCRL AB 4-0 PS2 18 (SUTURE) ×2 IMPLANT
SUT VIC AB 2-0 SH 27 (SUTURE) ×3
SUT VIC AB 2-0 SH 27XBRD (SUTURE) ×1 IMPLANT
SUT VIC AB 3-0 SH 27 (SUTURE) ×3
SUT VIC AB 3-0 SH 27X BRD (SUTURE) ×1 IMPLANT
SYR BULB 3OZ (MISCELLANEOUS) ×4 IMPLANT
SYR CONTROL 10ML LL (SYRINGE) ×2 IMPLANT
TOWEL OR 17X24 6PK STRL BLUE (TOWEL DISPOSABLE) ×2 IMPLANT

## 2013-12-14 NOTE — H&P (Signed)
[facility] [address] [place], [place] [address] [phone] A Division of [facility]  Ninetta Lights, M.D.   Robert A. Noemi Chapel, M.D.   Faythe Casa, M.D.   Johnny Bridge, M.D.   Almedia Balls, M.D Ernesta Amble. Percell Miller, M.D.  Joseph Pierini, M.D.  Lanier Prude, M.D.    Verner Chol, M.D. Mary L. Fenton Malling, PA-C  Kirstin A. Shepperson, PA-C  Josh [place], PA-C [place], [place]   RE: [place], [place]                                [phone]      DOB: [birth year] PROGRESS NOTE: 12-12-13 Trellis comes in for two issues.  First, his left knee.  Although he has modified his workout programs with less deep heavy squats, he has lately gotten more retropatellar irritation and noise, tightness and stiffness in the left knee.  This is the same side where he does have documented degenerative changes medially.  Not a lot of mechanical symptoms.  All his current symptoms are retropatellar.  Worse with going down stairs.  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   The other issue is his right shoulder.  Although he has some impingement symptoms, what is bothering him more is a very large, now increasing, symptomatic lipoma.  This is external to the deltoid on the lateral side of his shoulder.  Getting bigger and more symptomatic.  It does not affect function.  Again, a little bit of impingement symptoms, but those are tolerable.   I did do a left shoulder arthroscopy and decompression back in 2014.  That continues to do well without issues.   Remaining history is reviewed, updated and included in the chart.   EXAMINATION: General exam is outlined and included in the chart.  Specifically, healthy appearing 58 year-old male.  Lower extremity exam normal gait and stance.  Negative log roll of both hips.  Full motion of both knees.  On the left he has 2-3+ patellofemoral  crepitus.  No instability.  Good alignment.  A little soreness medially.  Trace effusion.  Stable ligaments.  Neurovascularly intact distally.  Upper extremity full motion of both shoulders.  On the left there is great motion.  Great strength.  No impingement signs.  On the right he has a mildly positive impingement.  A very obvious 4-5 centimeter lipoma external to the deltoid, just lateral to the acromion.  No instability.  Neurovascularly intact.    X-RAYS: Three view x-ray of his shoulder shows a Type II acromion.  Some spurring.  Reasonable subacromial space.  No acute bony abnormalities.  Four view x-ray of the left knee shows some small osteochondromas in both legs.  Nothing that looks concerning.  On the left he has moderate narrowing in the medial compartment that gets close to bone on bone in flexion, but preserved in extension.  Mild to moderate changes in the patellofemoral joint.  No acute fractures.   DISPOSITION:  1. In regards to his knee, we have talked about modifying things.  I have offered him an injection, which he is going to hold on for now.  Went over the do's and don'ts of his activity to lessen the force on the patellofemoral joint.  Follow up p.r.n.  2. Right shoulder.  We have talked about  an elective excision of his lipoma.  I think this is very localized, extramuscular and I am going to go ahead and proceed with that later this week.  I am not going to do any other formal imaging studies, which he understands and agrees with that approach.  Procedure, risks, benefits and complications reviewed in detail.  All paperwork complete.  All questions answered.  See him at the time of operative intervention.    Ninetta Lights, M.D.   Electronically verified by Ninetta Lights, M.D. DFM:jjh D 12-12-13 T 12-13-13

## 2013-12-14 NOTE — Discharge Instructions (Addendum)
May change bandage daily starting in 3 days.  May shower in 3 days, but do not soak incision.  Do not do any heavy lifting for the next week.  May apply ice for up to 20 minutes at a time for pain and swelling.  Follow up appointment in one week.   SEEK IMMEDIATE MEDICAL CARE IF:  There is pus or any unusual drainage coming from your incision sites.  You develop a fever.  You notice a bad smell coming from your incision sites.  Any of your incisions break open (edges do not stay together) after sutures or staples have been removed. Document Released: 11/07/2004 Document Revised: 09/04/2013 Document Reviewed: 09/13/2011 [facility] Patient Information 2015 [place], [place]. This information is not intended to replace advice given to you by your health care provider. Make sure you discuss any questions you have with your health care provider.

## 2013-12-15 ENCOUNTER — Encounter (HOSPITAL_BASED_OUTPATIENT_CLINIC_OR_DEPARTMENT_OTHER): Payer: Self-pay | Admitting: Orthopedic Surgery

## 2013-12-16 LAB — WOUND CULTURE
Culture: NO GROWTH
Gram Stain: NONE SEEN

## 2013-12-18 NOTE — Op Note (Signed)
NAME:  XADEN, KAUFMAN NO.:  MEDICAL RECORD NO.:  [id number]  LOCATION:                                 FACILITY:  PHYSICIAN:  Ninetta Lights, M.D. 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  POSTOPERATIVE DIAGNOSIS:  Well encapsulated apparent granuloma with necrotic debris as well as cloudy fluid did not appear infectious. Greater than 4 cm in diameter partially invested in the deltoid musculature.  PROCEDURE:  Excision of soft tissue mass, right lateral shoulder.  This included excision down into the superficial layers of the deltoid.  SURGEON:  Ninetta Lights, MD  ASSISTANT:  Doran Stabler, PA  ANESTHESIA:  Local with a field block.  SPECIMENS:  Excised mass, culture of the fluid was sent for aerobic and anaerobic culture although my concern of infection was very low.  BLOOD LOSS:  Minimal.  COMPLICATIONS:  None.  DRESSING:  Soft compressive.  DESCRIPTION OF PROCEDURE:  The patient was brought to the operating room.  I then under sterile technique did a field block with lidocaine with epinephrine.  Prepped and draped in usual sterile fashion.  A longitudinal incision directly over the very easily palpable mass.  Skin and subcutaneous tissue divided.  Although I initially felt this was a lipoma, it turned out to be a very well encapsulated mass that was invested down into the deltoid muscle.  This was excised in its entirety.  This was then opened and it was filled with granulation tissue, necrotic debris as well as some cloudy fluid.  Very thickened wall around this.  The bed was then thoroughly irrigated.  All aspects of the mass had been excised.  I loosely approximated the split in the deltoid with Vicryl, then a subcutaneous subcuticular closure.   Sterile compressive dressing applied.  Anesthesia reversed.  Brought to the recovery room.  Tolerated the surgery well.  No complications.     Ninetta Lights, M.D.     DFM/MEDQ  D:  12/15/2013  T:  12/15/2013  Job:  [id number]

## 2013-12-28 LAB — ANAEROBIC CULTURE: GRAM STAIN: NONE SEEN

## 2014-03-30 ENCOUNTER — Encounter (HOSPITAL_COMMUNITY): Payer: Managed Care, Other (non HMO)

## 2014-04-03 ENCOUNTER — Other Ambulatory Visit: Payer: Self-pay | Admitting: Urology

## 2014-04-03 DIAGNOSIS — D582 Other hemoglobinopathies: Secondary | ICD-10-CM

## 2014-04-05 ENCOUNTER — Other Ambulatory Visit: Payer: Self-pay | Admitting: Urology

## 2014-04-05 DIAGNOSIS — D582 Other hemoglobinopathies: Secondary | ICD-10-CM

## 2014-04-05 DIAGNOSIS — D751 Secondary polycythemia: Secondary | ICD-10-CM

## 2014-04-06 ENCOUNTER — Other Ambulatory Visit (HOSPITAL_COMMUNITY): Payer: Self-pay | Admitting: Urology

## 2014-04-06 ENCOUNTER — Encounter (HOSPITAL_COMMUNITY): Admission: RE | Admit: 2014-04-06 | Payer: Managed Care, Other (non HMO) | Source: Ambulatory Visit

## 2014-05-04 HISTORY — PX: OTHER SURGICAL HISTORY: SHX169

## 2014-06-13 ENCOUNTER — Encounter (HOSPITAL_BASED_OUTPATIENT_CLINIC_OR_DEPARTMENT_OTHER): Payer: Self-pay | Admitting: *Deleted

## 2014-06-13 ENCOUNTER — Emergency Department (HOSPITAL_BASED_OUTPATIENT_CLINIC_OR_DEPARTMENT_OTHER)
Admission: EM | Admit: 2014-06-13 | Discharge: 2014-06-13 | Disposition: A | Discharge status: Home / Self Care | Payer: No Typology Code available for payment source | Attending: Emergency Medicine | Admitting: Emergency Medicine

## 2014-06-13 ENCOUNTER — Emergency Department (HOSPITAL_BASED_OUTPATIENT_CLINIC_OR_DEPARTMENT_OTHER): Payer: No Typology Code available for payment source

## 2014-06-13 DIAGNOSIS — Z79899 Other long term (current) drug therapy: Secondary | ICD-10-CM | POA: Insufficient documentation

## 2014-06-13 DIAGNOSIS — H1033 Unspecified acute conjunctivitis, bilateral: Secondary | ICD-10-CM | POA: Insufficient documentation

## 2014-06-13 DIAGNOSIS — J209 Acute bronchitis, unspecified: Secondary | ICD-10-CM | POA: Insufficient documentation

## 2014-06-13 DIAGNOSIS — Z8739 Personal history of other diseases of the musculoskeletal system and connective tissue: Secondary | ICD-10-CM | POA: Insufficient documentation

## 2014-06-13 DIAGNOSIS — R05 Cough: Secondary | ICD-10-CM | POA: Diagnosis present

## 2014-06-13 DIAGNOSIS — J40 Bronchitis, not specified as acute or chronic: Secondary | ICD-10-CM

## 2014-06-13 DIAGNOSIS — J019 Acute sinusitis, unspecified: Secondary | ICD-10-CM | POA: Insufficient documentation

## 2014-06-13 DIAGNOSIS — H109 Unspecified conjunctivitis: Secondary | ICD-10-CM

## 2014-06-13 MED ORDER — ERYTHROMYCIN 5 MG/GM OP OINT: TOPICAL_OINTMENT | OPHTHALMIC | Status: AC

## 2014-06-13 MED ORDER — IPRATROPIUM-ALBUTEROL 0.5-2.5 (3) MG/3ML IN SOLN
3.0000 mL | Freq: Once | RESPIRATORY_TRACT | Status: AC
  Administered 2014-06-13: 3 mL via RESPIRATORY_TRACT
  Filled 2014-06-13: qty 3

## 2014-06-13 MED ORDER — PSEUDOEPHEDRINE HCL 60 MG PO TABS: 60.0000 mg | ORAL_TABLET | Freq: Two times a day (BID) | ORAL | Status: DC | PRN

## 2014-06-13 MED ORDER — AZITHROMYCIN 250 MG PO TABS: ORAL_TABLET | ORAL | Status: DC

## 2014-06-13 MED ORDER — PSEUDOEPHEDRINE HCL 30 MG PO TABS
60.0000 mg | ORAL_TABLET | Freq: Once | ORAL | Status: AC
  Administered 2014-06-13: 60 mg via ORAL
  Filled 2014-06-13: qty 2

## 2014-06-13 NOTE — ED Notes (Signed)
Patient transported to X-ray 

## 2014-06-13 NOTE — ED Provider Notes (Signed)
CSN: [id number]     Arrival date & time 06/13/14  2836 History   First MD Initiated Contact with Patient 06/13/14 [phone]     Chief Complaint  Patient presents with  . Cough     (Consider location/radiation/quality/duration/timing/severity/associated sxs/prior Treatment) HPI The patient reports he's had a cough and sinus congestion for 7 days. He denies any chest pain with coughing he has no associated shortness of breath. He reports all of the congestion seems to be between his head and his upper throat. He reports it feels like the mucous almost comes out but then just gets hung up in his throat and he can't get it to clear out. He reports this morning he felt like his throat was closing with mucus that he was choking on. He has also had red and draining eyes. There has been no fever and no facial pain. The patient was seen at [facility] last week and at the beginning of this week. He reports he was diagnosed with a viral illness and none of the medications given to him are helping. He reports aside from his head congestion and mucus sticking in his throat, he feels good. Past Medical History  Diagnosis Date  . DJD (degenerative joint disease)   . Medical history non-contributory    Past Surgical History  Procedure Laterality Date  . Appendectomy  2006    open  . Shoulder arthroscopy  1988    left  . Abscess drainage  2008    buttock  . Lumbar laminectomy  1997  . Knee arthroscopy  2083    rt  . Great toe arthrodesis, interphalangeal joint      rt  . Shoulder arthroscopy with rotator cuff repair and subacromial decompression Left 10/20/2012    Procedure: LEFT SHOULDER ARTHROSCOPY , DISTAL CLAVICULECTOMY, EXTENSIVE DEBRIDEMENT, WITH SUBACROMIAL DECOMPRESSION, DEBRIDE LABRUM ROTATOR CUFF;  Surgeon: Ninetta Lights, MD;  Location: [facility];  Service: Orthopedics;  Laterality: Left;  Marland Kitchen Mass excision Right 12/14/2013    Procedure: MINOR EXCISION OF MASS;  Surgeon:  Ninetta Lights, MD;  Location: [facility];  Service: Orthopedics;  Laterality: Right;   No family history on file. History  Substance Use Topics  . Smoking status: Never Smoker   . Smokeless tobacco: Not on file  . Alcohol Use: No     Comment: not in 32yr    Review of Systems 10 Systems reviewed and are negative for acute change except as noted in the HPI.    Allergies  Review of patient's allergies indicates no known allergies.  Home Medications   Prior to Admission medications   Medication Sig Start Date End Date Taking? Authorizing Provider  Multiple Vitamin (MULTIVITAMIN) capsule Take 1 capsule by mouth daily.   Yes Historical Provider, MD  testosterone cypionate (DEPOTESTOTERONE CYPIONATE) 100 MG/ML injection Inject 100 mg into the muscle every 7 (seven) days. For IM use only   Yes Historical Provider, MD  azithromycin (ZITHROMAX Z-PAK) 250 MG tablet 2 po day one, then 1 daily x 4 days 06/13/14   Charlesetta Shanks, MD  erythromycin ophthalmic ointment Place a 1/2 inch ribbon of ointment into the lower eyelid 4 times daily 06/13/14 06/20/14  Charlesetta Shanks, MD  ondansetron (ZOFRAN) 4 MG tablet Take 1 tablet (4 mg total) by mouth every 8 (eight) hours as needed for nausea or vomiting. 12/14/13   M. Tawanna Cooler, PA-C  oxyCODONE-acetaminophen (ROXICET) 5-325 MG per tablet Take 1-2 tablets by mouth every  4 (four) hours as needed. 12/14/13   M. Tawanna Cooler, PA-C  pseudoephedrine (SUDAFED) 60 MG tablet Take 1 tablet (60 mg total) by mouth 2 (two) times daily as needed for congestion. 06/13/14   Charlesetta Shanks, MD   BP 139/70 mmHg  Pulse 88  Temp(Src) 97.6 F (36.4 C) (Oral)  Resp 18  Ht 5\' 9"  (1.753 m)  Wt 230 lb (104.327 kg)  BMI 33.95 kg/m2  SpO2 94% Physical Exam  Constitutional: He is oriented to person, place, and time. He appears well-developed and well-nourished.  HENT:  Head: Normocephalic and atraumatic.  Eyes: EOM are normal. Pupils are equal,  round, and reactive to light.  Patient has very injected bilateral conjunctiva. There is dried exudate in his lashes. Extraocular motions are intact pupils are symmetric and round.  Neck: Neck supple.  Cardiovascular: Normal rate, regular rhythm, normal heart sounds and intact distal pulses.   Pulmonary/Chest: Effort normal and breath sounds normal. No respiratory distress. He has no wheezes. He has no rales.  The patient has dry cough with deep inspiration.  Abdominal: Soft. Bowel sounds are normal. He exhibits no distension. There is no tenderness.  Musculoskeletal: Normal range of motion. He exhibits no edema.  The patient has significant muscular hypertrophy consistent with bodybuilding.  Neurological: He is alert and oriented to person, place, and time. He has normal strength. Coordination normal. GCS eye subscore is 4. GCS verbal subscore is 5. GCS motor subscore is 6.  Skin: Skin is warm, dry and intact.  Psychiatric: He has a normal mood and affect.    ED Course  Procedures (including critical care time) Labs Review Labs Reviewed - No data to display  Imaging Review Dg Chest 2 View  06/13/2014   CLINICAL DATA:  Nasal congestion and cough  EXAM: CHEST  2 VIEW  COMPARISON:  07/29/2006 chest CT  FINDINGS: Normal heart size and mediastinal contours. No acute infiltrate or edema. No effusion or pneumothorax. Remote left AC joint injury with heterotopic ossification about the acromioclavicular joint and coracoclavicular ligament. No acute osseous findings.  IMPRESSION: No active cardiopulmonary disease.   Electronically Signed   By: Monte Fantasia M.D.   On: 06/13/2014 09:46     EKG Interpretation None      MDM   Final diagnoses:  Acute sinusitis, recurrence not specified, unspecified location  Bronchitis  Bilateral conjunctivitis   The patient is positive findings as outlined above. At this point time he has not had any response to symptomatic treatment. He does have  significant conjunctivitis with exudative discharge. I will change him from the prescribed ketorolac drops to erythromycin ointment. As well the patient is not had any response with nighttime cough to Hydromet 1 teaspoon. He is advised he may increase to 2 teaspoons. He has had persistent sinus symptoms and worsening bronchitis type symptoms for a week. At this point time I will provide for a Z-Pak and symptomatic use of pseudoephedrine for significant nasal congestion and postnasal drainage.    Charlesetta Shanks, MD 06/13/14 1037

## 2014-06-13 NOTE — Discharge Instructions (Signed)
Conjunctivitis DISCONTINUE THE PREVIOUSLY PRESCIBED EYE DROPS (KETORALAC). YOU MAY INCREASE YOUR NIGH TIME COUGH MEDICATION TO 2 TEASPOONS (HYDROMET). TRY SLEEPING WITH A COOL MIST HUMIDIFIER IN THE ROOM.  Conjunctivitis is commonly called "pink eye." Conjunctivitis can be caused by bacterial or viral infection, allergies, or injuries. There is usually redness of the lining of the eye, itching, discomfort, and sometimes discharge. There may be deposits of matter along the eyelids. A viral infection usually causes a watery discharge, while a bacterial infection causes a yellowish, thick discharge. Pink eye is very contagious and spreads by direct contact. You may be given antibiotic eyedrops as part of your treatment. Before using your eye medicine, remove all drainage from the eye by washing gently with warm water and cotton balls. Continue to use the medication until you have awakened 2 mornings in a row without discharge from the eye. Do not rub your eye. This increases the irritation and helps spread infection. Use separate towels from other household members. Wash your hands with soap and water before and after touching your eyes. Use cold compresses to reduce pain and sunglasses to relieve irritation from light. Do not wear contact lenses or wear eye makeup until the infection is gone. SEEK MEDICAL CARE IF:   Your symptoms are not better after 3 days of treatment.  You have increased pain or trouble seeing.  The outer eyelids become very red or swollen. Document Released: 05/28/2004 Document Revised: 07/13/2011 Document Reviewed: 04/20/2005 [facility] Patient Information 2015 [place], [place]. This information is not intended to replace advice given to you by your health care provider. Make sure you discuss any questions you have with your health care provider. Sinusitis Sinusitis is redness, soreness, and inflammation of the paranasal sinuses. Paranasal sinuses are air pockets within the bones of  your face (beneath the eyes, the middle of the forehead, or above the eyes). In healthy paranasal sinuses, mucus is able to drain out, and air is able to circulate through them by way of your nose. However, when your paranasal sinuses are inflamed, mucus and air can become trapped. This can allow bacteria and other germs to grow and cause infection. Sinusitis can develop quickly and last only a short time (acute) or continue over a long period (chronic). Sinusitis that lasts for more than 12 weeks is considered chronic.  CAUSES  Causes of sinusitis include:  Allergies.  Structural abnormalities, such as displacement of the cartilage that separates your nostrils (deviated septum), which can decrease the air flow through your nose and sinuses and affect sinus drainage.  Functional abnormalities, such as when the small hairs (cilia) that line your sinuses and help remove mucus do not work properly or are not present. SIGNS AND SYMPTOMS  Symptoms of acute and chronic sinusitis are the same. The primary symptoms are pain and pressure around the affected sinuses. Other symptoms include:  Upper toothache.  Earache.  Headache.  Bad breath.  Decreased sense of smell and taste.  A cough, which worsens when you are lying flat.  Fatigue.  Fever.  Thick drainage from your nose, which often is green and may contain pus (purulent).  Swelling and warmth over the affected sinuses. DIAGNOSIS  Your health care provider will perform a physical exam. During the exam, your health care provider may:  Look in your nose for signs of abnormal growths in your nostrils (nasal polyps).  Tap over the affected sinus to check for signs of infection.  View the inside of your sinuses (endoscopy) using  an imaging device that has a light attached (endoscope). If your health care provider suspects that you have chronic sinusitis, one or more of the following tests may be recommended:  Allergy tests.  Nasal  culture. A sample of mucus is taken from your nose, sent to a lab, and screened for bacteria.  Nasal cytology. A sample of mucus is taken from your nose and examined by your health care provider to determine if your sinusitis is related to an allergy. TREATMENT  Most cases of acute sinusitis are related to a viral infection and will resolve on their own within 10 days. Sometimes medicines are prescribed to help relieve symptoms (pain medicine, decongestants, nasal steroid sprays, or saline sprays).  However, for sinusitis related to a bacterial infection, your health care provider will prescribe antibiotic medicines. These are medicines that will help kill the bacteria causing the infection.  Rarely, sinusitis is caused by a fungal infection. In theses cases, your health care provider will prescribe antifungal medicine. For some cases of chronic sinusitis, surgery is needed. Generally, these are cases in which sinusitis recurs more than 3 times per year, despite other treatments. HOME CARE INSTRUCTIONS   Drink plenty of water. Water helps thin the mucus so your sinuses can drain more easily.  Use a humidifier.  Inhale steam 3 to 4 times a day (for example, sit in the bathroom with the shower running).  Apply a warm, moist washcloth to your face 3 to 4 times a day, or as directed by your health care provider.  Use saline nasal sprays to help moisten and clean your sinuses.  Take medicines only as directed by your health care provider.  If you were prescribed either an antibiotic or antifungal medicine, finish it all even if you start to feel better. SEEK IMMEDIATE MEDICAL CARE IF:  You have increasing pain or severe headaches.  You have nausea, vomiting, or drowsiness.  You have swelling around your face.  You have vision problems.  You have a stiff neck.  You have difficulty breathing. MAKE SURE YOU:   Understand these instructions.  Will watch your condition.  Will get help  right away if you are not doing well or get worse. Document Released: 04/20/2005 Document Revised: 09/04/2013 Document Reviewed: 05/05/2011 [facility] Patient Information 2015 [place], [place]. This information is not intended to replace advice given to you by your health care provider. Make sure you discuss any questions you have with your health care provider.

## 2014-06-13 NOTE — ED Notes (Signed)
Pt returned from xray

## 2014-06-13 NOTE — ED Notes (Signed)
Patient states he has a one week history of sinus congestion and productive cough.  States he has seen his PCP x 2 due to the cough and redness of his eyes.  States he has had to sleep in a recliner to breath, and last night he became choked on the mucus in his throat.  Denies fever.

## 2016-07-29 ENCOUNTER — Emergency Department (HOSPITAL_BASED_OUTPATIENT_CLINIC_OR_DEPARTMENT_OTHER)
Admission: EM | Admit: 2016-07-29 | Discharge: 2016-07-29 | Disposition: A | Discharge status: Home / Self Care | Payer: BLUE CROSS/BLUE SHIELD | Attending: Emergency Medicine | Admitting: Emergency Medicine

## 2016-07-29 DIAGNOSIS — R040 Epistaxis: Secondary | ICD-10-CM | POA: Diagnosis present

## 2016-07-29 DIAGNOSIS — R030 Elevated blood-pressure reading, without diagnosis of hypertension: Secondary | ICD-10-CM | POA: Diagnosis not present

## 2016-07-29 DIAGNOSIS — H6123 Impacted cerumen, bilateral: Secondary | ICD-10-CM | POA: Insufficient documentation

## 2016-07-29 LAB — CBC WITH DIFFERENTIAL/PLATELET
Basophils Absolute: 0 10*3/uL (ref 0.0–0.1)
Basophils Relative: 0 %
Eosinophils Absolute: 0 10*3/uL (ref 0.0–0.7)
Eosinophils Relative: 1 %
HCT: 48.8 % (ref 39.0–52.0)
Hemoglobin: 16.8 g/dL (ref 13.0–17.0)
LYMPHS PCT: 15 %
Lymphs Abs: 1.1 10*3/uL (ref 0.7–4.0)
MCH: 29.5 pg (ref 26.0–34.0)
MCHC: 34.4 g/dL (ref 30.0–36.0)
MCV: 85.8 fL (ref 78.0–100.0)
Monocytes Absolute: 0.5 10*3/uL (ref 0.1–1.0)
Monocytes Relative: 6 %
Neutro Abs: 5.8 10*3/uL (ref 1.7–7.7)
Neutrophils Relative %: 78 %
Platelets: 238 10*3/uL (ref 150–400)
RBC: 5.69 MIL/uL (ref 4.22–5.81)
RDW: 15.4 % (ref 11.5–15.5)
WBC: 7.4 10*3/uL (ref 4.0–10.5)

## 2016-07-29 LAB — BASIC METABOLIC PANEL
ANION GAP: 7 (ref 5–15)
BUN: 25 mg/dL — AB (ref 6–20)
CHLORIDE: 105 mmol/L (ref 101–111)
CO2: 25 mmol/L (ref 22–32)
Calcium: 9.1 mg/dL (ref 8.9–10.3)
Creatinine, Ser: 1.18 mg/dL (ref 0.61–1.24)
GFR calc non Af Amer: >60 mL/min (ref >60)
Glucose, Bld: 125 mg/dL — ABNORMAL HIGH (ref 65–99)
POTASSIUM: 3.9 mmol/L (ref 3.5–5.1)
SODIUM: 137 mmol/L (ref 135–145)

## 2016-07-29 MED ORDER — SILVER NITRATE-POT NITRATE 75-25 % EX MISC
1.0000 | Freq: Once | CUTANEOUS | Status: AC
  Administered 2016-07-29: 1 via TOPICAL
  Filled 2016-07-29: qty 1

## 2016-07-29 NOTE — ED Notes (Signed)
ED Provider at bedside. 

## 2016-07-29 NOTE — ED Provider Notes (Signed)
[facility] Provider Note   CSN: [id number] Arrival date & time: 07/29/16  1110     History   Chief Complaint Chief Complaint  Patient presents with  . Epistaxis    HPI Brad Davis is a 61 y.o. male.  HPI Brad Davis is a 61 y.o. male presents to emergency department complaining of nosebleed and elevated blood pressure. Patient states he has had a daily nosebleed that is sometimes hard to stop over the last 5 days. He denies any injuries. He reports his blood pressures higher than usual. He denies any headache, changes in vision, no chest pain or shortness of breath. No history of blood clotting disorders. He states he is concerned because he has never had any nosebleeds in the past. He states he does have some nasal congestion is worse in the morning. He does blow his nose daily. He states yesterday the nose started bleeding while eating dinner, denies blowing it or picking at it. 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 Patient is also requesting earwax removal from bilateral ears. History of the same. Has not used anything over-the-counter.  Past Medical History:  Diagnosis Date  . DJD (degenerative joint disease)   . Medical history non-contributory     There are no active problems to display for this patient.   Past Surgical History:  Procedure Laterality Date  . ABSCESS DRAINAGE  2008   buttock  . APPENDECTOMY  2006   open  . GREAT TOE ARTHRODESIS, INTERPHALANGEAL JOINT     rt  . KNEE ARTHROSCOPY  2083   rt  . LUMBAR LAMINECTOMY  1997  . MASS EXCISION Right 12/14/2013   Procedure: MINOR EXCISION OF MASS;  Surgeon: Ninetta Lights, MD;  Location: [facility];  Service: Orthopedics;  Laterality: Right;  . SHOULDER ARTHROSCOPY  1988   left  . SHOULDER ARTHROSCOPY WITH ROTATOR CUFF REPAIR AND SUBACROMIAL  DECOMPRESSION Left 10/20/2012   Procedure: LEFT SHOULDER ARTHROSCOPY , DISTAL CLAVICULECTOMY, EXTENSIVE DEBRIDEMENT, WITH SUBACROMIAL DECOMPRESSION, DEBRIDE LABRUM ROTATOR CUFF;  Surgeon: Ninetta Lights, MD;  Location: [facility];  Service: Orthopedics;  Laterality: Left;       Home Medications    Prior to Admission medications   Not on File    Family History No family history on file.  Social History Social History  Substance Use Topics  . Smoking status: Never Smoker  . Smokeless tobacco: Not on file  . Alcohol use No     Comment: not in 22yr     Allergies   Patient has no known allergies.   Review of Systems Review of Systems  Constitutional: Negative for chills and fever.  HENT: Positive for nosebleeds. Negative for ear pain.   Respiratory: Negative for cough, chest tightness and shortness of breath.   Cardiovascular: Negative for chest pain, palpitations and leg swelling.  Gastrointestinal: Negative for abdominal distention, abdominal pain, diarrhea, nausea and vomiting.  Genitourinary: Negative for dysuria, frequency, hematuria and urgency.  Musculoskeletal: Negative for arthralgias, myalgias, neck pain and neck stiffness.  Skin: Negative for rash.  Allergic/Immunologic: Negative for immunocompromised state.  Neurological: Negative for dizziness, weakness, light-headedness, numbness and headaches.  All other systems reviewed and are negative.    Physical Exam Updated Vital Signs BP (!) 156/75 (BP Location: Left Arm)   Pulse 96   Temp 99.1 F (37.3 C) (Oral)   Resp 18  Ht 5\' 9"  (1.753 m)   Wt 104.3 kg   SpO2 98%   BMI 33.97 kg/m   Physical Exam  Constitutional: He appears well-developed and well-nourished. No distress.  HENT:  Head: Normocephalic and atraumatic.  Dried up blood in right nostril. No active bleeding. Cerumen impaction bilaterally  Eyes: Conjunctivae are normal.  Neck: Neck supple.  Cardiovascular: Normal rate,  regular rhythm and normal heart sounds.   Pulmonary/Chest: Effort normal. No respiratory distress. He has no wheezes. He has no rales.  Musculoskeletal: He exhibits no edema.  Neurological: He is alert.  Skin: Skin is warm and dry.  Nursing note and vitals reviewed.    ED Treatments / Results  Labs (all labs ordered are listed, but only abnormal results are displayed) Labs Reviewed  CBC WITH DIFFERENTIAL/PLATELET  BASIC METABOLIC PANEL    EKG  EKG Interpretation None       Radiology No results found.  Procedures .Epistaxis Management Date/Time: 07/29/2016 12:44 PM Performed by: Jeannett Senior Authorized by: Jeannett Senior   Consent:    Consent obtained:  Verbal   Consent given by:  Patient   Risks discussed:  Bleeding, infection, nasal injury and pain   Alternatives discussed:  No treatment Anesthesia (see MAR for exact dosages):    Anesthesia method:  None Procedure details:    Treatment site:  R anterior   Treatment method:  Silver nitrate Post-procedure details:    Assessment:  Bleeding stopped   Patient tolerance of procedure:  Tolerated well, no immediate complications   (including critical care time) . Medications Ordered in ED Medications  silver nitrate applicators applicator 1 Stick (not administered)     Initial Impression / Assessment and Plan / ED Course  I have reviewed the triage vital signs and the nursing notes.  Pertinent labs & imaging results that were available during my care of the patient were reviewed by me and considered in my medical decision making (see chart for details).     Pt in ED with nose bleeds, elevated BP. BP here is 156/75. WIll check basic labs, given pt on multiple supplements. Will cauterize the bleeding spot in right   12:44 PM Patient had emergency department with nosebleed, his blood work is unremarkable. Silver nitrate stick was used to cauterize right anterior nasal bleed. Patient has no bleeding at  this time. Attempted to irrigate ear wax from his ears bilaterally, however has not produced good results and patient stated he needed to leave to be somewhere. Advised to use Debrox solution and over-the-counter ear irrigation kits. Instructed to keep nasal saline drops in his nostrils several times a day, try to use Vaseline, do not blow your nose, follow-up with family doctor.   Vitals:   07/29/16 1134  BP: (!) 156/75  Pulse: 96  Resp: 18  Temp: 99.1 F (37.3 C)  TempSrc: Oral  SpO2: 98%  Weight: 104.3 kg  Height: 5\' 9"  (1.753 m)     Final Clinical Impressions(s) / ED Diagnoses   Final diagnoses:  Epistaxis  Elevated blood pressure reading  Bilateral impacted cerumen    New Prescriptions New Prescriptions   No medications on file     Jeannett Senior, PA-C 07/29/16 [place], PA-C 07/29/16 [place], MD 07/29/16 1507

## 2016-07-29 NOTE — ED Triage Notes (Signed)
c/o intermittent nose bleeds x 2 weeks-also requesting ears to be "checked for wax build up"-none at present-NAD-steady gait

## 2016-07-29 NOTE — Discharge Instructions (Signed)
If bleeding starts again, pinch your nose firmly and hold for 10 min. Try nasal saline drops or Vaseline to avoid dryness. Avoid blowing your nose vigorously. Try over the counter debrox solution or ear was removal kits.

## 2020-04-03 DIAGNOSIS — Z8616 Personal history of COVID-19: Secondary | ICD-10-CM

## 2020-04-03 HISTORY — DX: Personal history of COVID-19: Z86.16

## 2020-07-23 ENCOUNTER — Other Ambulatory Visit: Payer: Self-pay | Admitting: Orthopaedic Surgery

## 2020-07-23 DIAGNOSIS — M25512 Pain in left shoulder: Secondary | ICD-10-CM

## 2020-07-30 ENCOUNTER — Other Ambulatory Visit: Payer: Self-pay

## 2020-07-30 ENCOUNTER — Ambulatory Visit
Admission: RE | Admit: 2020-07-30 | Discharge: 2020-07-30 | Disposition: A | Discharge status: Home / Self Care | Payer: BC Managed Care – PPO | Source: Ambulatory Visit | Attending: Orthopaedic Surgery | Admitting: Orthopaedic Surgery

## 2020-07-30 DIAGNOSIS — M25512 Pain in left shoulder: Secondary | ICD-10-CM

## 2020-08-02 HISTORY — PX: OTHER SURGICAL HISTORY: SHX169

## 2020-08-10 ENCOUNTER — Other Ambulatory Visit: Payer: No Typology Code available for payment source

## 2020-10-01 ENCOUNTER — Other Ambulatory Visit: Payer: Self-pay | Admitting: Orthopaedic Surgery

## 2020-10-01 DIAGNOSIS — M25521 Pain in right elbow: Secondary | ICD-10-CM

## 2020-10-11 ENCOUNTER — Ambulatory Visit
Admission: RE | Admit: 2020-10-11 | Discharge: 2020-10-11 | Disposition: A | Discharge status: Home / Self Care | Payer: BC Managed Care – PPO | Source: Ambulatory Visit | Attending: Orthopaedic Surgery | Admitting: Orthopaedic Surgery

## 2020-10-11 DIAGNOSIS — M25521 Pain in right elbow: Secondary | ICD-10-CM

## 2020-11-11 ENCOUNTER — Other Ambulatory Visit: Payer: Self-pay | Admitting: Orthopaedic Surgery

## 2020-12-02 HISTORY — PX: OTHER SURGICAL HISTORY: SHX169

## 2022-01-29 ENCOUNTER — Other Ambulatory Visit: Payer: Self-pay | Admitting: Urology

## 2022-02-04 ENCOUNTER — Encounter (HOSPITAL_BASED_OUTPATIENT_CLINIC_OR_DEPARTMENT_OTHER): Payer: Self-pay | Admitting: Urology

## 2022-02-04 ENCOUNTER — Other Ambulatory Visit: Payer: Self-pay

## 2022-02-04 NOTE — Progress Notes (Signed)
Spoke w/ via phone for pre-op interview---pt Lab needs dos----none              Lab results------ COVID test -----patient states asymptomatic no test needed Arrive at -------730 am 02-12-2021 NPO after MN NO Solid Food.  Clear liquids from MN until---630 am Med rec completed Medications to take morning of surgery -----pt wishes to take no meds day of surgery Diabetic medication -----n/a Patient instructed no nail polish to be worn day of surgery Patient instructed to bring photo id and insurance card day of surgery Patient aware to have Driver (ride ) / caregiver  sister rhonda heavner   for 24 hours after surgery  Patient Special Instructions -----pt given overnight stay instructions Pre-Op special Istructions -----none Patient verbalized understanding of instructions that were given at this phone interview. Patient denies shortness of breath, chest pain, fever, cough at this phone interview.

## 2022-02-12 ENCOUNTER — Observation Stay (HOSPITAL_BASED_OUTPATIENT_CLINIC_OR_DEPARTMENT_OTHER)
Admission: RE | Admit: 2022-02-12 | Discharge: 2022-02-13 | Disposition: A | Discharge status: Home / Self Care | Payer: Medicare Other | Attending: Urology | Admitting: Urology

## 2022-02-12 ENCOUNTER — Ambulatory Visit (HOSPITAL_BASED_OUTPATIENT_CLINIC_OR_DEPARTMENT_OTHER): Payer: Medicare Other | Admitting: Certified Registered Nurse Anesthetist

## 2022-02-12 ENCOUNTER — Encounter (HOSPITAL_BASED_OUTPATIENT_CLINIC_OR_DEPARTMENT_OTHER): Payer: Self-pay | Admitting: Urology

## 2022-02-12 ENCOUNTER — Other Ambulatory Visit: Payer: Self-pay

## 2022-02-12 ENCOUNTER — Encounter (HOSPITAL_BASED_OUTPATIENT_CLINIC_OR_DEPARTMENT_OTHER)
Admission: RE | Disposition: A | Discharge status: Home / Self Care | Payer: Self-pay | Source: Home / Self Care | Attending: Urology

## 2022-02-12 DIAGNOSIS — N401 Enlarged prostate with lower urinary tract symptoms: Secondary | ICD-10-CM | POA: Diagnosis present

## 2022-02-12 DIAGNOSIS — Z7982 Long term (current) use of aspirin: Secondary | ICD-10-CM | POA: Diagnosis not present

## 2022-02-12 DIAGNOSIS — R3912 Poor urinary stream: Secondary | ICD-10-CM | POA: Insufficient documentation

## 2022-02-12 DIAGNOSIS — Z79899 Other long term (current) drug therapy: Secondary | ICD-10-CM | POA: Diagnosis not present

## 2022-02-12 DIAGNOSIS — R3915 Urgency of urination: Secondary | ICD-10-CM | POA: Diagnosis not present

## 2022-02-12 DIAGNOSIS — R350 Frequency of micturition: Secondary | ICD-10-CM | POA: Diagnosis not present

## 2022-02-12 DIAGNOSIS — N3943 Post-void dribbling: Secondary | ICD-10-CM | POA: Diagnosis not present

## 2022-02-12 DIAGNOSIS — Z01818 Encounter for other preprocedural examination: Secondary | ICD-10-CM

## 2022-02-12 DIAGNOSIS — N320 Bladder-neck obstruction: Secondary | ICD-10-CM | POA: Insufficient documentation

## 2022-02-12 DIAGNOSIS — N138 Other obstructive and reflux uropathy: Secondary | ICD-10-CM

## 2022-02-12 HISTORY — PX: TRANSURETHRAL RESECTION OF PROSTATE: SHX73

## 2022-02-12 HISTORY — DX: Secondary polycythemia: D75.1

## 2022-02-12 HISTORY — DX: Malignant (primary) neoplasm, unspecified: C80.1

## 2022-02-12 SURGERY — TURP (TRANSURETHRAL RESECTION OF PROSTATE)
Anesthesia: General | Site: Prostate

## 2022-02-12 MED ORDER — EPHEDRINE SULFATE (PRESSORS) 50 MG/ML IJ SOLN
INTRAMUSCULAR | Status: DC | PRN
  Administered 2022-02-12: 5 mg via INTRAVENOUS

## 2022-02-12 MED ORDER — SODIUM CHLORIDE 0.9 % IR SOLN
3000.0000 mL | Status: DC
  Administered 2022-02-12 – 2022-02-13 (×2): 3000 mL

## 2022-02-12 MED ORDER — ONDANSETRON HCL 4 MG/2ML IJ SOLN
INTRAMUSCULAR | Status: DC | PRN
  Administered 2022-02-12: 4 mg via INTRAVENOUS

## 2022-02-12 MED ORDER — CIPROFLOXACIN HCL 500 MG PO TABS
500.0000 mg | ORAL_TABLET | Freq: Two times a day (BID) | ORAL | Status: DC
  Administered 2022-02-12 – 2022-02-13 (×2): 500 mg via ORAL

## 2022-02-12 MED ORDER — CEFAZOLIN SODIUM-DEXTROSE 2-4 GM/100ML-% IV SOLN
2.0000 g | INTRAVENOUS | Status: AC
  Administered 2022-02-12: 2 g via INTRAVENOUS

## 2022-02-12 MED ORDER — OXYCODONE HCL 5 MG PO TABS: 5.0000 mg | ORAL_TABLET | Freq: Once | ORAL | Status: DC | PRN

## 2022-02-12 MED ORDER — MIDAZOLAM HCL 2 MG/2ML IJ SOLN
INTRAMUSCULAR | Status: DC | PRN
  Administered 2022-02-12: 2 mg via INTRAVENOUS

## 2022-02-12 MED ORDER — HYDROMORPHONE HCL 1 MG/ML IJ SOLN: 0.5000 mg | INTRAMUSCULAR | Status: DC | PRN

## 2022-02-12 MED ORDER — TRAMADOL HCL 50 MG PO TABS: 50.0000 mg | ORAL_TABLET | Freq: Four times a day (QID) | ORAL | Status: DC | PRN

## 2022-02-12 MED ORDER — DEXAMETHASONE SODIUM PHOSPHATE 10 MG/ML IJ SOLN
INTRAMUSCULAR | Status: AC
  Filled 2022-02-12: qty 1

## 2022-02-12 MED ORDER — PHENYLEPHRINE 80 MCG/ML (10ML) SYRINGE FOR IV PUSH (FOR BLOOD PRESSURE SUPPORT)
PREFILLED_SYRINGE | INTRAVENOUS | Status: AC
  Filled 2022-02-12: qty 10

## 2022-02-12 MED ORDER — FENTANYL CITRATE (PF) 250 MCG/5ML IJ SOLN
INTRAMUSCULAR | Status: DC | PRN
  Administered 2022-02-12: 50 ug via INTRAVENOUS
  Administered 2022-02-12: 25 ug via INTRAVENOUS

## 2022-02-12 MED ORDER — CEFAZOLIN SODIUM-DEXTROSE 2-4 GM/100ML-% IV SOLN
INTRAVENOUS | Status: AC
  Filled 2022-02-12: qty 100

## 2022-02-12 MED ORDER — ACETAMINOPHEN 10 MG/ML IV SOLN
INTRAVENOUS | Status: AC
  Filled 2022-02-12: qty 100

## 2022-02-12 MED ORDER — ESMOLOL HCL 100 MG/10ML IV SOLN
INTRAVENOUS | Status: AC
  Filled 2022-02-12: qty 10

## 2022-02-12 MED ORDER — PROPOFOL 10 MG/ML IV BOLUS
INTRAVENOUS | Status: DC | PRN
  Administered 2022-02-12: 200 mg via INTRAVENOUS

## 2022-02-12 MED ORDER — LIDOCAINE 2% (20 MG/ML) 5 ML SYRINGE
INTRAMUSCULAR | Status: DC | PRN
  Administered 2022-02-12: 60 mg via INTRAVENOUS

## 2022-02-12 MED ORDER — SODIUM CHLORIDE 0.45 % IV SOLN: INTRAVENOUS | Status: DC

## 2022-02-12 MED ORDER — PHENYLEPHRINE 80 MCG/ML (10ML) SYRINGE FOR IV PUSH (FOR BLOOD PRESSURE SUPPORT)
PREFILLED_SYRINGE | INTRAVENOUS | Status: DC | PRN
  Administered 2022-02-12 (×2): 80 ug via INTRAVENOUS
  Administered 2022-02-12: 160 ug via INTRAVENOUS
  Administered 2022-02-12 (×3): 80 ug via INTRAVENOUS
  Administered 2022-02-12: 160 ug via INTRAVENOUS

## 2022-02-12 MED ORDER — EPHEDRINE 5 MG/ML INJ
INTRAVENOUS | Status: AC
  Filled 2022-02-12: qty 5

## 2022-02-12 MED ORDER — HYDROMORPHONE HCL 1 MG/ML IJ SOLN: 0.2500 mg | INTRAMUSCULAR | Status: DC | PRN

## 2022-02-12 MED ORDER — LACTATED RINGERS IV SOLN: INTRAVENOUS | Status: DC

## 2022-02-12 MED ORDER — PROPOFOL 10 MG/ML IV BOLUS
INTRAVENOUS | Status: AC
  Filled 2022-02-12: qty 20

## 2022-02-12 MED ORDER — PROMETHAZINE HCL 25 MG/ML IJ SOLN: 6.2500 mg | INTRAMUSCULAR | Status: DC | PRN

## 2022-02-12 MED ORDER — LIDOCAINE HCL (PF) 2 % IJ SOLN
INTRAMUSCULAR | Status: AC
  Filled 2022-02-12: qty 5

## 2022-02-12 MED ORDER — AMISULPRIDE (ANTIEMETIC) 5 MG/2ML IV SOLN: 10.0000 mg | Freq: Once | INTRAVENOUS | Status: DC | PRN

## 2022-02-12 MED ORDER — PHENYLEPHRINE 80 MCG/ML (10ML) SYRINGE FOR IV PUSH (FOR BLOOD PRESSURE SUPPORT)
PREFILLED_SYRINGE | INTRAVENOUS | Status: AC
  Filled 2022-02-12: qty 20

## 2022-02-12 MED ORDER — MIDAZOLAM HCL 2 MG/2ML IJ SOLN
INTRAMUSCULAR | Status: AC
  Filled 2022-02-12: qty 2

## 2022-02-12 MED ORDER — SODIUM CHLORIDE 0.9 % IR SOLN
Status: DC | PRN
  Administered 2022-02-12 (×2): 6000 mL
  Administered 2022-02-12: 3000 mL
  Administered 2022-02-12: 6000 mL
  Administered 2022-02-12: 3000 mL
  Administered 2022-02-12: 6000 mL

## 2022-02-12 MED ORDER — MEPERIDINE HCL 25 MG/ML IJ SOLN: 6.2500 mg | INTRAMUSCULAR | Status: DC | PRN

## 2022-02-12 MED ORDER — CIPROFLOXACIN HCL 500 MG PO TABS
ORAL_TABLET | ORAL | Status: AC
  Filled 2022-02-12: qty 1

## 2022-02-12 MED ORDER — OXYCODONE HCL 5 MG/5ML PO SOLN: 5.0000 mg | Freq: Once | ORAL | Status: DC | PRN

## 2022-02-12 MED ORDER — 0.9 % SODIUM CHLORIDE (POUR BTL) OPTIME
TOPICAL | Status: DC | PRN
  Administered 2022-02-12: 500 mL

## 2022-02-12 MED ORDER — ZOLPIDEM TARTRATE 5 MG PO TABS: 5.0000 mg | ORAL_TABLET | Freq: Every evening | ORAL | Status: DC | PRN

## 2022-02-12 MED ORDER — ESMOLOL HCL 100 MG/10ML IV SOLN
INTRAVENOUS | Status: DC | PRN
  Administered 2022-02-12 (×4): 10 mg via INTRAVENOUS

## 2022-02-12 MED ORDER — DEXAMETHASONE SODIUM PHOSPHATE 10 MG/ML IJ SOLN
INTRAMUSCULAR | Status: DC | PRN
  Administered 2022-02-12: 10 mg via INTRAVENOUS

## 2022-02-12 MED ORDER — FENTANYL CITRATE (PF) 100 MCG/2ML IJ SOLN
INTRAMUSCULAR | Status: AC
  Filled 2022-02-12: qty 2

## 2022-02-12 MED ORDER — ONDANSETRON HCL 4 MG/2ML IJ SOLN
INTRAMUSCULAR | Status: AC
  Filled 2022-02-12: qty 2

## 2022-02-12 MED ORDER — ACETAMINOPHEN 10 MG/ML IV SOLN
INTRAVENOUS | Status: DC | PRN
  Administered 2022-02-12: 1000 mg via INTRAVENOUS

## 2022-02-12 MED ORDER — STERILE WATER FOR IRRIGATION IR SOLN
Status: DC | PRN
  Administered 2022-02-12: 30 mL

## 2022-02-12 SURGICAL SUPPLY — 22 items
BAG DRAIN URO-CYSTO SKYTR STRL (DRAIN) ×2 IMPLANT
BAG DRN RND TRDRP ANRFLXCHMBR (UROLOGICAL SUPPLIES) ×1
BAG DRN UROCATH (DRAIN) ×1
BAG URINE DRAIN 2000ML AR STRL (UROLOGICAL SUPPLIES) ×2 IMPLANT
CATH FOLEY 3WAY 30CC 24FR (CATHETERS) ×1
CATH URTH STD 24FR FL 3W 2 (CATHETERS) IMPLANT
EVACUATOR MICROVAS BLADDER (UROLOGICAL SUPPLIES) IMPLANT
GLOVE BIO SURGEON STRL SZ7.5 (GLOVE) ×2 IMPLANT
GOWN STRL REUS W/TWL XL LVL3 (GOWN DISPOSABLE) ×2 IMPLANT
HOLDER FOLEY CATH W/STRAP (MISCELLANEOUS) IMPLANT
IV NS IRRIG 3000ML ARTHROMATIC (IV SOLUTION) ×8 IMPLANT
KIT TURNOVER CYSTO (KITS) ×2 IMPLANT
LOOP CUT BIPOLAR 24F LRG (ELECTROSURGICAL) IMPLANT
MANIFOLD NEPTUNE II (INSTRUMENTS) ×2 IMPLANT
NS IRRIG 500ML POUR BTL (IV SOLUTION) IMPLANT
PACK CYSTO (CUSTOM PROCEDURE TRAY) ×2 IMPLANT
SYR 30ML LL (SYRINGE) IMPLANT
SYR TOOMEY IRRIG 70ML (MISCELLANEOUS) ×1
SYRINGE TOOMEY IRRIG 70ML (MISCELLANEOUS) IMPLANT
TUBE CONNECTING 12X1/4 (SUCTIONS) IMPLANT
TUBING UROLOGY SET (TUBING) IMPLANT
WATER STERILE IRR 500ML POUR (IV SOLUTION) IMPLANT

## 2022-02-12 NOTE — Op Note (Signed)
Preoperative diagnosis:  Bladder outlet obstruction Urinary frequency/weak stream  Postoperative diagnosis:  same   Procedure:  Transurethral resection of prostate  Surgeon: Ardis Hughs, MD   Anesthesia: General   Complications: None   Intraoperative findings: Cystoscopy demonstrated  a normal appearing bladder mucosa without tumor/stones/or abnormalities.  UOs were orthopic.  Prostate demonstrated a large median lobe with lateral lobe obstruction.  EBL: 100cc   Specimens: Prostate chips  Indication: Brad Davis is a 65 y.o. patient with bladder outlet obstruction/urinary retention/obstructive voiding symptoms.  After reviewing the management options for treatment, he elected to proceed with the above surgical procedure(s). We have discussed the potential benefits and risks of the procedure, side effects of the proposed treatment, the likelihood of the patient achieving the goals of the procedure, and any potential problems that might occur during the procedure or recuperation. Informed consent has been obtained.  Description of procedure:  The patient was taken to the operating room and general anesthesia was induced. The patient was placed in the dorsal lithotomy position, prepped and draped in the usual sterile fashion, and preoperative antibiotics were administered. A preoperative time-out was performed.   I then gently passed the 21 French 30 cystoscope into the patient's urethra and up into the bladder under visual guidance. A 360 cystoscopic evaluation was then performed with the above findings.  I then removed the 21 French cystoscopic sheath and replacement with a 26 French resectoscope sheath was passed and using the visual obturator under direct vision. An exchange the obturator for the resectoscope itself and the loop cautery. I then proceeded to create grooves within the prostate at the 7:00 position extending the defect from the bladder neck at 7:00 down to  the prostate apex just lateral to the verumontanum. I took this groove down to the capsule. I then performed a similar maneuver on the patient's left side at the 5:00 position taking the prostate down from the bladder neck to the apex and down the prostatic capsule. At this point I proceeded to resect the patient's right lateral lobe in a systematic fashion moving from the 7:00 position up to approximately 11. The resection was taken down to the prostate capsule. Hemostasis was achieved on this side prior to moving to the patient's left lateral lobe. A similar sequence was performed taking the prostate down from the 5:00 position to approximately the 1:00 position to the level of the prostatic capsule. I then completed the resection of the posterior wall as well as the area between 5:00 and 7:00 along the bladder neck. I was careful not to undermine the bladder neck or resect the inferior. I then did resect the area that was protruding into the prostatic urethra anteriorly.   Once I was satisfied that the prostate had been adequately resected I evacuated the prostate chips using a To[place]y syringe. I then reintroduced the resectoscope and ensured adequate hemostasis. I then left the bladder full and removed the resectoscope entirely. Exam under anesthesia demonstrated a normal sized prostate with no nodules.  I then placed a 22 French three-way Foley catheter. I irrigated the catheter gently and removed all debris and clots. I then placed the patient on gentle continuous bladder irrigation.  He subsequently extubated and returned to PACU in excellent condition.  Ardis Hughs, MD

## 2022-02-12 NOTE — Anesthesia Postprocedure Evaluation (Signed)
Anesthesia Post Note  Patient: Brad Davis  Procedure(s) Performed: TRANSURETHRAL RESECTION OF THE PROSTATE (TURP) (Prostate)     Patient location during evaluation: PACU Anesthesia Type: General Level of consciousness: awake and alert Pain management: pain level controlled Vital Signs Assessment: post-procedure vital signs reviewed and stable Respiratory status: spontaneous breathing, nonlabored ventilation and respiratory function stable Cardiovascular status: blood pressure returned to baseline and stable Postop Assessment: no apparent nausea or vomiting Anesthetic complications: no   No notable events documented.  Last Vitals:  Vitals:   02/12/22 1215 02/12/22 1223  BP: 114/78 113/69  Pulse: 85 (!) 56  Resp: 11 16  Temp:  (!) 36.4 C  SpO2: 98% 97%    Last Pain:  Vitals:   02/12/22 1223  TempSrc:   PainSc: 0-No pain                 Lynda Rain[place]

## 2022-02-12 NOTE — H&P (Deleted)
interval evaluation of low testosterone ?HPI: Brad Davis is a 66 year-old male patient who was referred by Dr. Ardis Hughs, MD who is here interval f/u for low testosterone. ??The patient was started on testosterone replacement therapy on 10/30/2014. The patient was last seen 2/23. The patient is currently taking Testosterone 1.65% (generic) - 3 pumps/day for his testosterone replacement therapy. ??Testosterone: 538. ??Hemoglobin: 14.9. ??PSA: 0.66 (on finasteride). ??The patient complains of decreased libido. Over the past 6 months the patient denies any progressive voiding symptoms including worsening frequency, urgency, or dysuria. ??He does have problems with erectile dysfunction. ??04/2019: Had symptoms of stone in ureter which at this point he has passed. Has trialed tamsulosin 0.8 mg daily with no significant change, and was reduced back to 0.4 mg daily. He was started on finasteride 5 mg daily in December 2019. Complaining of weak stream, nocturia x1 ??Spring 2021 - patient underwent bilateral ureteroscopy for nonobstructing stones. ?7/23: Transrectal ultrasound demonstrates prominent median lobe with some intravesical protrusion. Volume 48.8 g ??Interval: Patient is seen today for further discussion of bladder outlet surgery. I saw him several weeks ago following his transrectal ultrasound. At that time he was considering treating his lower urinary tract symptoms. His symptoms are predominantly hesitancy, postvoid dribble, frequency and urgency. The patient and I discussed treatment options including TURP, Rezum, and UroLift. ?? ALLERGIES: No Allergies?  MEDICATIONS: Aspirin 81 mg tablet,chewable Oral ?Atorvastatin Calcium 40 mg tablet ?[place] ?Furosemide 40 mg tablet ?GLIPIZIDE ER PO Daily ?Losartan-Hydrochlorothiazide 100 mg-25 mg tablet ?Metformin Hcl 500 mg tablet Oral ?Metoprolol Succinate 50 mg tablet, extended release 24 hr Oral ?Multivitamins tablet Oral ?Omega-3 CAPS Oral  ?Pioglitazone Hcl 30 mg tablet ?Prednisone ?Testosterone 20.25 mg/1.25 gram per actuation (1.62 %) gel in metered-dose pump apply three pumps to shoulder one time daily ?Vitamin B Complex capsule Oral ?Vitamin D3 25 mcg (1,000 unit) tablet Oral ?   Notes: androgel 20.25 mg/1.25 gram per actuation (1.62%)- 3 pumps GU PSH: Cysto Uretero Lithotripsy - 2010?Cystoscopy Insert Stent - 2010?ESWL - 2010?Ureteroscopic laser litho, Right - 2021, Left - 2021?? NON-GU PSH: Back surgery - 2019?Cataract Surgery.. - about 2011?Diagnostic Colonoscopy - about 2018?Laparoscopy, Surgical; Repair Umbilical Hernia - about 2020?? ? GU PMH: BPH w/LUTS - 12/16/2021, - 12/09/2021, - 06/30/2021, - 12/09/2020, - 2022, - 2017, Benign prostatic hyperplasia with urinary obstruction, - 2016?Post-void dribbling - 12/16/2021?Urinary Urgency - 12/16/2021, - 12/09/2021, Urinary urgency, - 2016?Weak Urinary Stream - 12/16/2021?Primary hypogonadism - 12/09/2021, - 06/30/2021, - 12/09/2020, - 2022, - 2021, - 2021, - 2020, - 2019, - 2019, - 2019, - 2018, Hypogonadism, testicular, - 2016?History of urolithiasis (Stable) - 12/09/2020, - 2022, Nephrolithiasis, - 2014?ED due to arterial insufficiency - 2022, Erectile dysfunction due to arterial insufficiency, - 2016?Renal and ureteral calculus - 2019, Left, - 2019?Urinary Frequency - 2017?Abdominal Pain Unspec, Pain most likely related to distal right ureteral stone. - 2017?Microscopic hematuria (Acute), Secondary to distal right ureteral stone. - 2017?Renal calculus - 2017, Nephrolithiasis, - 2016?Gross hematuria, Gross Hematuria - 2014?Ureteral calculus, Proximal Ureteral Stone On The Right - 2014?  ?PMH Notes: 1/18: CT revealed a 4 mm right mid to distal ureteral calculus. He also had several bilateral nonobstructing stones. He's had between 3 and 6 clinical stone events in his life. His past majority of these spontaneously. He had a experience with ESWL with the stone did not fragment and he required  ureteroscopy with stent placement. NON-GU PMH: Personal history of other endocrine, nutritional and metabolic disease, History of diabetes mellitus -  2016, History of hypercholesterolemia, - 2014?Obstructive sleep apnea (adult) (pediatric), Obstructive sleep apnea, adult - 2016?Occlusion and stenosis of left carotid artery, Stenosis of left carotid artery - 2016?Encounter for general adult medical examination without abnormal findings, Encounter for preventive health examination - 2015?Personal history of other diseases of the circulatory system, History of hypertension - 2014?? FAMILY HISTORY: Breast Cancer - Mother, Sister?Death In The Family Father - Runs In Family?Death In The Family Mother - Runs In Family?Heart Disease - Mother, Father?nephrolithiasis - Father SOCIAL HISTORY: Marital Status: Widowed?Preferred Language: [place]; Ethnicity: Not Hispanic Or Latino; Race: White?Current Smoking Status: Patient does not smoke anymore. Has not smoked since 10/14/1975. Smoked 1 pack per day. ?Does not use smokeless tobacco.?Drinks 1 drinks per week. Light Drinker. ?Does not use drugs.?Drinks 1 caffeinated drink per day.?Has not had a blood transfusion.? REVIEW OF SYSTEMS:   GU Review Male:  Patient denies frequent urination, hard to postpone urination, burning/ pain with urination, get up at night to urinate, leakage of urine, stream starts and stops, trouble starting your stream, have to strain to urinate , erection problems, and penile pain.  Gastrointestinal (Upper):  Patient denies nausea, vomiting, and indigestion/ heartburn. Gastrointestinal (Lower):  Patient denies diarrhea and constipation. Constitutional:  Patient denies fever, night sweats, weight loss, and fatigue. Skin:  Patient denies skin rash/ lesion and itching. Eyes:  Patient denies blurred vision and double vision. Ears/ Nose/ Throat:  Patient denies sore throat and sinus problems. Hematologic/Lymphatic:  Patient denies  swollen glands and easy bruising. Cardiovascular:  Patient denies leg swelling and chest pains. Respiratory:  Patient denies cough and shortness of breath. Endocrine:  Patient denies excessive thirst. Musculoskeletal:  Patient denies back pain and joint pain. Neurological:  Patient denies headaches and dizziness. Psychologic:  Patient denies depression and anxiety. VITAL SIGNS: None  MULTI-SYSTEM PHYSICAL EXAMINATION:   Respiratory: Normal breath sounds. No labored breathing, no use of accessory muscles.  Cardiovascular: Regular rate and rhythm. No murmur, no gallop. Normal temperature, normal extremity pulses, no swelling, no varicosities.  ?  Complexity of Data:  Source Of History:  Patient Lab Test Review:  PSA Records Review:  Pathology Reports, Previous Doctor Records, Previous Patient Records, POC Tool Urine Test Review:  Urinalysis X-Ray Review: Prostate Ultrasound: Reviewed Films. Discussed With Patient. ?   12/02/21 06/23/21 12/02/20 05/28/20 11/07/19 04/24/19 10/24/18 04/25/18 PSA Total PSA 0.66 ng/mL 0.34 ng/mL 0.92 ng/mL 0.56 ng/mL 0.63 ng/mL 0.50 ng/mL 0.46 ng/mL 0.93 ng/mL   12/02/21 06/23/21 12/02/20 05/28/20 11/07/19 04/24/19 10/24/18 04/25/18 Hormones Testosterone, Total 538.1 ng/dL 330.8 ng/dL 432.5 ng/dL 469.2 ng/dL 450.3 ng/dL 419.8 ng/dL 176.1 ng/dL 587.8 ng/dL ? PROCEDURES: None  ASSESSMENT:    ICD-10 Details 1 GU:  BPH w/LUTS - N40.1  2  Post-void dribbling - N39.43  3  Urinary Urgency - R39.15  4  Weak Urinary Stream - R39.12   PLAN:  Document  Letter(s):  Created for Patient: Clinical Summary  Notes:  I went through the treatment options for this patient. I think would be well served with a TURP. We discussed the surgery in detail. I described the surgery technique as well as the expected hospital course. We discussed the risks and associated benefits. Specifically I talked about  the risk of persistent prostatic bleeding and gross hematuria. We discussed the risk of bladder neck contracture. We discussed the risks of urethral stricture. We also discussed the risks of failure to alleviate his symptoms. I told him that I would expect that he would have improved  urinary tract symptoms over the course of the next 6 weeks. Having gone through all of that the patient has opted to proceed with TURP. He he would like to get that scheduled sometime in October. We will work to get that scheduled for him at his convenience. ??

## 2022-02-12 NOTE — Anesthesia Preprocedure Evaluation (Addendum)
Anesthesia Evaluation  Patient identified by MRN, date of birth, ID band Patient awake    Reviewed: Allergy & Precautions, H&P , NPO status , Patient's Chart, lab work & pertinent test results  Airway Mallampati: I  TM Distance: >3 FB Neck ROM: full    Dental no notable dental hx. (+) Teeth Intact, Dental Advidsory Given   Pulmonary neg pulmonary ROS,    Pulmonary exam normal breath sounds clear to auscultation       Cardiovascular negative cardio ROS Normal cardiovascular exam Rhythm:Regular Rate:Normal     Neuro/Psych negative neurological ROS  negative psych ROS   GI/Hepatic negative GI ROS, Neg liver ROS,   Endo/Other  negative endocrine ROS  Renal/GU negative Renal ROS  negative genitourinary   Musculoskeletal negative musculoskeletal ROS (+) Arthritis , Osteoarthritis,    Abdominal Normal abdominal exam  (+)   Peds negative pediatric ROS (+)  Hematology negative hematology ROS (+)   Anesthesia Other Findings   Reproductive/Obstetrics negative OB ROS                            Anesthesia Physical  Anesthesia Plan  ASA: 2  Anesthesia Plan: General   Post-op Pain Management: Ofirmev IV (intra-op)*   Induction: Intravenous  PONV Risk Score and Plan: 2 and Ondansetron, Midazolam and Treatment may vary due to age or medical condition  Airway Management Planned: LMA  Additional Equipment:   Intra-op Plan:   Post-operative Plan: Extubation in OR  Informed Consent: I have reviewed the patients History and Physical, chart, labs and discussed the procedure including the risks, benefits and alternatives for the proposed anesthesia with the patient or authorized representative who has indicated his/her understanding and acceptance.     Dental advisory given  Plan Discussed with: CRNA, Anesthesiologist and Surgeon  Anesthesia Plan Comments:         Anesthesia Quick  Evaluation

## 2022-02-12 NOTE — H&P (Signed)
The patient is an anabolic steroid user, is a Art therapist. He obtains his testosterone from an outside source. 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   The patient presents today for voiding symptoms. I saw him 2 years ago, and at that time I discussed with him his voiding symptoms. I started him on tamsulosin which seem to make a significant difference in his flow. He has been getting up 3 or 4 times a night now for the past several years and the Flomax was not helpful for that. He does not feel like he empties his bladder completely. He does not have any urinary urgency. Denies any history of hematuria. Denies any dysuria.   The patient has completely normal erectile function.  He has no family history of prostate cancer.   3/23: Today the patient presents for annual evaluation. A PSA drawn prior to his appointment today. He is on tamsulosin 0.4 mg and finasteride 5 mg. He is complaining of worsening nocturia and weak stream. He says when he misses his tamsulosin he has a very very hard time urinating. Denies any hematuria. He denies any urinary retention. He denies any recurrent infections. He denies any incontinence.   Interval: Today the patient is here for reevaluation. He had a prostate ultrasound done prior to his appointment today. This demonstrated a 90 g prostate with a very large and protuberant intravesical median lobe. The patient states that since he was last here his voiding has really progressed, despite doubling the tamsulosin. He voids sometimes every 15 to 20 minutes. He never feels like he is emptying his bladder completely. Denies any hematuria dysuria.   The patient had his shoulder repaired 1 year ago. He tolerated anesthesia without any difficulty. He has no other comorbidities.     ALLERGIES: No Allergies    MEDICATIONS: Tamsulosin Hcl 0.4 mg capsule 2 capsule PO Daily  Glutamine CAPS Oral  One-A-Day Men's 400 mcg-20 mcg-300 mcg  tablet Oral  Proscar 5 mg tablet 1 tablet PO Daily  Protein Powder powder Oral  Super Greens Oral Powder 0 Oral  Testosterone Cypionate 200 mg/ml vial  Vitamin C 500 mg tablet Oral     GU PSH: No GU PSH      PSH Notes: Appendectomy, Knee Surgery, Shoulder Surgery, Shoulder Surgery, Back Surgery   NON-GU PSH: Appendectomy - 2014     GU PMH: BPH w/LUTS - 07/04/2021, - 2022, - 2019, Benign prostatic hyperplasia with urinary obstruction, - 2016 Elevated PSA - 07/04/2021 Nocturia - 07/04/2021, Nocturia, - 2014 Urinary Obstruction - 2019 ED due to arterial insufficiency, Erectile dysfunction due to arterial insufficiency - 2016 Overactive bladder, Overactive bladder - 2014      PMH Notes:  2013-02-23 10:54:19 - Note: Abnormal RBC Volume   NON-GU PMH: Encounter for general adult medical examination without abnormal findings, Encounter for preventive health examination - 2016 Encounter for vasectomy consultation, Encounter for vasectomy counseling - 2015    FAMILY HISTORY: Family Health Status - Father alive at age 64 - Father Family Health Status - Mother's Age - Mother Family Health Status Number - Runs In Family No Significant Family History - Runs In Family   SOCIAL HISTORY: Marital Status: Single Preferred Language: English; Ethnicity: Not Hispanic Or Latino; Race: White     Notes: Never A Smoker, Occupation:, Caffeine Use, Tobacco Use, Marital History - Separated, Alcohol Use   REVIEW OF SYSTEMS:    GU Review Male:   Patient  reports frequent urination and trouble starting your stream. Patient denies hard to postpone urination, burning/ pain with urination, get up at night to urinate, leakage of urine, stream starts and stops, have to strain to urinate , erection problems, and penile pain.  Gastrointestinal (Upper):   Patient denies nausea, vomiting, and indigestion/ heartburn.  Gastrointestinal (Lower):   Patient denies diarrhea and constipation.  Constitutional:   Patient denies  fever, night sweats, fatigue, and weight loss.  Skin:   Patient denies skin rash/ lesion and itching.  Eyes:   Patient denies blurred vision and double vision.  Ears/ Nose/ Throat:   Patient denies sore throat and sinus problems.  Hematologic/Lymphatic:   Patient denies swollen glands and easy bruising.  Cardiovascular:   Patient denies leg swelling and chest pains.  Respiratory:   Patient denies cough and shortness of breath.  Endocrine:   Patient denies excessive thirst.  Musculoskeletal:   Patient denies back pain and joint pain.  Neurological:   Patient denies headaches and dizziness.  Psychologic:   Patient denies depression and anxiety.   VITAL SIGNS:      01/27/2022 12:38 PM  BP 130/82 mmHg  Pulse 79 /min  Temperature 97.7 F / 36.5 C   MULTI-SYSTEM PHYSICAL EXAMINATION:    Constitutional: Well-nourished. No physical deformities. Normally developed. Good grooming.  Respiratory: Normal breath sounds. No labored breathing, no use of accessory muscles.   Cardiovascular: Regular rate and rhythm. No murmur, no gallop. Normal temperature, normal extremity pulses, no swelling, no varicosities.      Complexity of Data:  Source Of History:  Patient  Lab Test Review:   PSA  Records Review:   Previous Doctor Records, Previous Patient Records, POC Tool  Urine Test Review:   Urinalysis  X-Ray Review: TRUSP/BX: Reviewed Films. Discussed With Patient.     07/02/21 06/27/20 08/10/19 06/29/19 05/28/17 02/05/15 10/27/13 12/27/12  PSA  Total PSA 2.14 ng/mL 2.39 ng/mL 2.90 ng/mL 4.82 ng/mL 2.83 ng/dl 1.26  1.65  1.31     07/05/13 02/23/13 12/28/12  Hormones  Testosterone, Total 1890.08  566  1246.40     PROCEDURES:         Prostate Ultrasound - 14782  Length: 5.7 cm Height: 5.4 cm Width: 5.6 cm Volume: 90.0 ml      The transrectal ultrasound probe is introduced into the rectum, and the prostate is visualized. Ultrasonography is utilized throughout the procedure. At the conclusion of  the procedure, the ultrasound probe is removed. The patient tolerates the procedure without complication.  Patient confirmed No Neulasta OnPro Device.           Urinalysis Dipstick Dipstick Cont'd  Color: Yellow Bilirubin: Neg mg/dL  Appearance: Clear Ketones: Neg mg/dL  Specific Gravity: 1.015 Blood: Neg ery/uL  pH: <=5.0 Protein: Neg mg/dL  Glucose: Neg mg/dL Urobilinogen: 0.2 mg/dL    Nitrites: Neg    Leukocyte Esterase: Neg leu/uL    ASSESSMENT:      ICD-10 Details  1 GU:   Urinary Obstruction - N13.8   2   BPH w/LUTS - N40.1    PLAN:           Document Letter(s):  Created for Patient: Clinical Summary         Notes:   The patient's prostate ultrasound demonstrates a very large protuberant median lobe. This is certainly contributing quite significantly to his inability to empty his bladder, and his voiding symptoms. We discussed that management strategies at this point, and I do not  think medication is a viable option moving forward. Instead, I think he needs to be more aggressive and I recommended TURP. I went through the surgery with him in detail including the risk and the benefits. He understands that this will require hospitalization as well as an overnight stay. He understands he will have a catheter following surgery and that we will try to take it out on day 1.   After going through all the risk and the benefits and answering all his questions he is opted to proceed.

## 2022-02-12 NOTE — Interval H&P Note (Signed)
History and Physical Interval Note:  02/12/2022 9:26 AM  Brad Davis  has presented today for surgery, with the diagnosis of BENIGN PROSTATIC HYPERPLASIA WITH URINARY OBSTRUCTION.  The various methods of treatment have been discussed with the patient and family. After consideration of risks, benefits and other options for treatment, the patient has consented to  Procedure(s) with comments: [place] (TURP) (N/A) - 100 MINUTES NEEDED FOR CASE as a surgical intervention.  The patient's history has been reviewed, patient examined, no change in status, stable for surgery.  I have reviewed the patient's chart and labs.  Questions were answered to the patient's satisfaction.     Ardis Hughs

## 2022-02-12 NOTE — Transfer of Care (Signed)
Immediate Anesthesia Transfer of Care Note  Patient: Brad Davis  Procedure(s) Performed: TRANSURETHRAL RESECTION OF THE PROSTATE (TURP) (Prostate)  Patient Location: PACU  Anesthesia Type:General  Level of Consciousness: sedated  Airway & Oxygen Therapy: Patient Spontanous Breathing and Patient connected to face mask oxygen  Post-op Assessment: Report given to RN and Post -op Vital signs reviewed and stable  Post vital signs: Reviewed and stable  Last Vitals:  Vitals Value Taken Time  BP 107/58 02/12/22 1130  Temp    Pulse 84 02/12/22 1132  Resp 12 02/12/22 1132  SpO2 100 % 02/12/22 1132  Vitals shown include unvalidated device data.  Last Pain:  Vitals:   02/12/22 0734  TempSrc: Oral         Complications: No notable events documented.

## 2022-02-12 NOTE — Anesthesia Procedure Notes (Signed)
Procedure Name: LMA Insertion Date/Time: 02/12/2022 9:39 AM  Performed by: Clearnce Sorrel, CRNAPre-anesthesia Checklist: Patient identified, Emergency Drugs available, Suction available and Patient being monitored Patient Re-evaluated:Patient Re-evaluated prior to induction Oxygen Delivery Method: Circle System Utilized Preoxygenation: Pre-oxygenation with 100% oxygen Induction Type: IV induction Ventilation: Mask ventilation without difficulty LMA: LMA inserted LMA Size: 4.0 Number of attempts: 1 Airway Equipment and Method: Bite block Placement Confirmation: positive ETCO2 Tube secured with: Tape Dental Injury: Teeth and Oropharynx as per pre-operative assessment

## 2022-02-13 ENCOUNTER — Encounter (HOSPITAL_BASED_OUTPATIENT_CLINIC_OR_DEPARTMENT_OTHER): Payer: Self-pay | Admitting: Urology

## 2022-02-13 DIAGNOSIS — N401 Enlarged prostate with lower urinary tract symptoms: Secondary | ICD-10-CM | POA: Diagnosis not present

## 2022-02-13 LAB — SURGICAL PATHOLOGY

## 2022-02-13 MED ORDER — PHENAZOPYRIDINE HCL 200 MG PO TABS: 200.0000 mg | ORAL_TABLET | Freq: Three times a day (TID) | ORAL | 0 refills | Status: DC | PRN

## 2022-02-13 MED ORDER — CIPROFLOXACIN HCL 500 MG PO TABS
ORAL_TABLET | ORAL | Status: AC
  Filled 2022-02-13: qty 1

## 2022-02-13 NOTE — Discharge Instructions (Signed)

## 2022-02-13 NOTE — Discharge Summary (Signed)
Date of admission: 02/12/2022  Date of discharge: 02/13/2022  Admission diagnosis: BPH w/ obstructive voiding symptoms/weak stream  Discharge diagnosis: same  Secondary diagnoses:  Patient Active Problem List   Diagnosis Date Noted   BPH with obstruction/lower urinary tract symptoms 02/12/2022    Procedures performed: Procedure(s): TRANSURETHRAL RESECTION OF THE PROSTATE (TURP)  History and Physical: For full details, please see admission history and physical. Briefly, Brad Davis is a 66 y.o. year old patient with severe lower urinary tract symptoms.   Hospital Course: Patient tolerated the procedure well.  He was then transferred to the floor after an uneventful PACU stay.  His hospital course was uncomplicated.  On POD#1 he had met discharge criteria: was eating a regular diet, was up and ambulating independently,  pain was well controlled, was voiding without a catheter, and was ready to for discharge.   PE on day of discharge: NAD Vitals:   02/12/22 1930 02/12/22 2238 02/13/22 0239 02/13/22 0611  BP: 119/80 136/79 113/79 131/80  Pulse: 93 89 66 76  Resp: 14 18 16 14  Temp: 97.7 F (36.5 C) 97.7 F (36.5 C) 97.6 F (36.4 C) 97.8 F (36.6 C)  TempSrc: Oral     SpO2: 94% 96% 97% 95%  Weight:      Height:        Intake/Output Summary (Last 24 hours) at 02/13/2022 0844 Last data filed at 02/13/2022 0815 Gross per 24 hour  Intake 13846.25 ml  Output 26926 ml  Net -13079.75 ml   Abdomen is soft Non labored breathing Extremities symmetric  Laboratory values:  No results for input(s): "WBC", "HGB", "HCT" in the last 72 hours. No results for input(s): "NA", "K", "CL", "CO2", "GLUCOSE", "BUN", "CREATININE", "CALCIUM" in the last 72 hours. No results for input(s): "LABPT", "INR" in the last 72 hours. No results for input(s): "LABURIN" in the last 72 hours. Results for orders placed or performed during the hospital encounter of 12/14/13  Wound culture      Status: None   Collection Time: 12/14/13  1:30 PM  Result Value Ref Range Status   Specimen Description WOUND RIGHT SHOULDER  Final   Special Requests NONE  Final   Gram Stain   Final    NO WBC SEEN NO SQUAMOUS EPITHELIAL CELLS SEEN NO ORGANISMS SEEN Performed at [facility]   Culture   Final    NO GROWTH 2 DAYS Performed at [facility]   Report Status 12/16/2013 FINAL  Final  Anaerobic culture     Status: None   Collection Time: 12/14/13  1:30 PM  Result Value Ref Range Status   Specimen Description WOUND RIGHT SHOULDER  Final   Special Requests NONE  Final   Gram Stain   Final    NO WBC SEEN NO SQUAMOUS EPITHELIAL CELLS SEEN NO ORGANISMS SEEN Performed at [facility]   Culture   Final    NO ANAEROBES ISOLATED Note: NO PROPIONIBACTERIUM SPECIES ISOLATED Performed at [facility]   Report Status 12/28/2013 FINAL  Final    Disposition: Home  Discharge instruction: The patient was instructed to be ambulatory but told to refrain from heavy lifting, strenuous activity, or driving.   Discharge medications:  Allergies as of 02/13/2022   No Known Allergies      Medication List     STOP taking these medications    TADALAFIL PO       TAKE these medications    Creatinine Powd by Does not   apply route.   meloxicam 15 MG tablet Commonly known as: MOBIC Take 15 mg by mouth daily.   multivitamin with minerals tablet Take 1 tablet by mouth daily.   OVER THE COUNTER MEDICATION Creatinine 5 mg daily   OVER THE COUNTER MEDICATION Whey protein powder 2 scoops  (  50 mg)qhs   phenazopyridine 200 MG tablet Commonly known as: Pyridium Take 1 tablet (200 mg total) by mouth 3 (three) times daily as needed for pain.   UNABLE TO FIND Testosterone 250 mg injection Sunday and thursday   vitamin C with rose hips 1000 MG tablet Take 1,000 mg by mouth 2 (two) times daily.        Followup:   Follow-up Information     Karen Kays, NP Follow up on 02/25/2022.   Specialty: Nurse Practitioner Why: 10:45 Contact information: [address] [place] [address] [phone]

## 2022-03-19 ENCOUNTER — Emergency Department (HOSPITAL_BASED_OUTPATIENT_CLINIC_OR_DEPARTMENT_OTHER)
Admission: EM | Admit: 2022-03-19 | Discharge: 2022-03-19 | Discharge status: Against Medical Advice | Payer: Medicare Other | Attending: Emergency Medicine | Admitting: Emergency Medicine

## 2022-03-19 ENCOUNTER — Other Ambulatory Visit: Payer: Self-pay

## 2022-03-19 ENCOUNTER — Encounter (HOSPITAL_BASED_OUTPATIENT_CLINIC_OR_DEPARTMENT_OTHER): Payer: Self-pay

## 2022-03-19 DIAGNOSIS — R319 Hematuria, unspecified: Secondary | ICD-10-CM | POA: Diagnosis present

## 2022-03-19 DIAGNOSIS — Z5321 Procedure and treatment not carried out due to patient leaving prior to being seen by health care provider: Secondary | ICD-10-CM | POA: Insufficient documentation

## 2022-03-19 LAB — URINALYSIS, ROUTINE W REFLEX MICROSCOPIC: Specific Gravity, Urine: <1.005 — ABNORMAL LOW (ref 1.005–1.030)

## 2022-03-19 LAB — CBC
HCT: 43.7 % (ref 39.0–52.0)
Hemoglobin: 14.5 g/dL (ref 13.0–17.0)
MCH: 29 pg (ref 26.0–34.0)
MCHC: 33.2 g/dL (ref 30.0–36.0)
MCV: 87.4 fL (ref 80.0–100.0)
Platelets: 207 10*3/uL (ref 150–400)
RBC: 5 MIL/uL (ref 4.22–5.81)
RDW: 14.6 % (ref 11.5–15.5)
WBC: 8.7 10*3/uL (ref 4.0–10.5)
nRBC: 0 % (ref 0.0–0.2)

## 2022-03-19 LAB — BASIC METABOLIC PANEL
Anion gap: 8 (ref 5–15)
BUN: 37 mg/dL — ABNORMAL HIGH (ref 8–23)
CO2: 22 mmol/L (ref 22–32)
Calcium: 9 mg/dL (ref 8.9–10.3)
Chloride: 108 mmol/L (ref 98–111)
Creatinine, Ser: 1.18 mg/dL (ref 0.61–1.24)
GFR, Estimated: >60 mL/min (ref >60)
Glucose, Bld: 109 mg/dL — ABNORMAL HIGH (ref 70–99)
Potassium: 3.7 mmol/L (ref 3.5–5.1)
Sodium: 138 mmol/L (ref 135–145)

## 2022-03-19 LAB — URINALYSIS, MICROSCOPIC (REFLEX): RBC / HPF: >50 RBC/hpf (ref 0–5)

## 2022-03-19 NOTE — ED Triage Notes (Signed)
Pt states he's urinating bright red blood w associated "pressure feeling" in low abd today. Reports turp sx 5wks ago w no issue. Endorses urge to urinate w no relief, concern for UTI

## 2022-03-20 ENCOUNTER — Ambulatory Visit (HOSPITAL_COMMUNITY): Payer: Medicare Other | Admitting: Anesthesiology

## 2022-03-20 ENCOUNTER — Ambulatory Visit (HOSPITAL_BASED_OUTPATIENT_CLINIC_OR_DEPARTMENT_OTHER): Payer: Medicare Other | Admitting: Anesthesiology

## 2022-03-20 ENCOUNTER — Encounter (HOSPITAL_COMMUNITY): Payer: Self-pay | Admitting: Urology

## 2022-03-20 ENCOUNTER — Other Ambulatory Visit: Payer: Self-pay | Admitting: Urology

## 2022-03-20 ENCOUNTER — Ambulatory Visit (HOSPITAL_COMMUNITY)
Admission: RE | Admit: 2022-03-20 | Discharge: 2022-03-20 | Disposition: A | Discharge status: Home / Self Care | Payer: Medicare Other | Source: Ambulatory Visit | Attending: Urology | Admitting: Urology

## 2022-03-20 ENCOUNTER — Encounter (HOSPITAL_COMMUNITY)
Admission: RE | Disposition: A | Discharge status: Home / Self Care | Payer: Self-pay | Source: Ambulatory Visit | Attending: Urology

## 2022-03-20 DIAGNOSIS — R319 Hematuria, unspecified: Secondary | ICD-10-CM | POA: Diagnosis not present

## 2022-03-20 DIAGNOSIS — I748 Embolism and thrombosis of other arteries: Secondary | ICD-10-CM | POA: Diagnosis not present

## 2022-03-20 DIAGNOSIS — Y836 Removal of other organ (partial) (total) as the cause of abnormal reaction of the patient, or of later complication, without mention of misadventure at the time of the procedure: Secondary | ICD-10-CM | POA: Diagnosis not present

## 2022-03-20 DIAGNOSIS — R310 Gross hematuria: Secondary | ICD-10-CM

## 2022-03-20 DIAGNOSIS — Z9889 Other specified postprocedural states: Secondary | ICD-10-CM | POA: Diagnosis not present

## 2022-03-20 HISTORY — PX: TRANSURETHRAL RESECTION OF PROSTATE: SHX73

## 2022-03-20 SURGERY — TURP (TRANSURETHRAL RESECTION OF PROSTATE)
Anesthesia: General | Site: Bladder

## 2022-03-20 MED ORDER — OXYCODONE HCL 5 MG PO TABS: 5.0000 mg | ORAL_TABLET | Freq: Once | ORAL | Status: DC | PRN

## 2022-03-20 MED ORDER — PROPOFOL 10 MG/ML IV BOLUS
INTRAVENOUS | Status: DC | PRN
  Administered 2022-03-20: 160 mg via INTRAVENOUS

## 2022-03-20 MED ORDER — DEXAMETHASONE SODIUM PHOSPHATE 10 MG/ML IJ SOLN
INTRAMUSCULAR | Status: DC | PRN
  Administered 2022-03-20: 10 mg via INTRAVENOUS

## 2022-03-20 MED ORDER — FENTANYL CITRATE (PF) 100 MCG/2ML IJ SOLN
INTRAMUSCULAR | Status: DC | PRN
  Administered 2022-03-20: 50 ug via INTRAVENOUS

## 2022-03-20 MED ORDER — CHLORHEXIDINE GLUCONATE 0.12 % MT SOLN: 15.0000 mL | Freq: Once | OROMUCOSAL | Status: DC

## 2022-03-20 MED ORDER — LIDOCAINE 2% (20 MG/ML) 5 ML SYRINGE
INTRAMUSCULAR | Status: DC | PRN
  Administered 2022-03-20: 80 mg via INTRAVENOUS

## 2022-03-20 MED ORDER — ONDANSETRON HCL 4 MG/2ML IJ SOLN
INTRAMUSCULAR | Status: DC | PRN
  Administered 2022-03-20: 4 mg via INTRAVENOUS

## 2022-03-20 MED ORDER — OXYCODONE HCL 5 MG/5ML PO SOLN: 5.0000 mg | Freq: Once | ORAL | Status: DC | PRN

## 2022-03-20 MED ORDER — FENTANYL CITRATE PF 50 MCG/ML IJ SOSY: 25.0000 ug | PREFILLED_SYRINGE | INTRAMUSCULAR | Status: DC | PRN

## 2022-03-20 MED ORDER — MIDAZOLAM HCL 2 MG/2ML IJ SOLN
INTRAMUSCULAR | Status: AC
  Filled 2022-03-20: qty 2

## 2022-03-20 MED ORDER — MEPERIDINE HCL 50 MG/ML IJ SOLN: 6.2500 mg | INTRAMUSCULAR | Status: DC | PRN

## 2022-03-20 MED ORDER — SULFAMETHOXAZOLE-TRIMETHOPRIM 800-160 MG PO TABS: 1.0000 | ORAL_TABLET | Freq: Two times a day (BID) | ORAL | 0 refills | Status: DC

## 2022-03-20 MED ORDER — ACETAMINOPHEN 325 MG PO TABS: 325.0000 mg | ORAL_TABLET | ORAL | Status: DC | PRN

## 2022-03-20 MED ORDER — ACETAMINOPHEN 160 MG/5ML PO SOLN: 325.0000 mg | ORAL | Status: DC | PRN

## 2022-03-20 MED ORDER — CIPROFLOXACIN IN D5W 400 MG/200ML IV SOLN
400.0000 mg | INTRAVENOUS | Status: AC
  Administered 2022-03-20: 400 mg via INTRAVENOUS
  Filled 2022-03-20: qty 200

## 2022-03-20 MED ORDER — LACTATED RINGERS IV SOLN: INTRAVENOUS | Status: DC

## 2022-03-20 MED ORDER — MIDAZOLAM HCL 5 MG/5ML IJ SOLN
INTRAMUSCULAR | Status: DC | PRN
  Administered 2022-03-20: 2 mg via INTRAVENOUS

## 2022-03-20 MED ORDER — PHENYLEPHRINE 80 MCG/ML (10ML) SYRINGE FOR IV PUSH (FOR BLOOD PRESSURE SUPPORT)
PREFILLED_SYRINGE | INTRAVENOUS | Status: DC | PRN
  Administered 2022-03-20 (×4): 80 ug via INTRAVENOUS

## 2022-03-20 MED ORDER — ONDANSETRON HCL 4 MG/2ML IJ SOLN: 4.0000 mg | Freq: Once | INTRAMUSCULAR | Status: DC | PRN

## 2022-03-20 MED ORDER — FENTANYL CITRATE (PF) 100 MCG/2ML IJ SOLN
INTRAMUSCULAR | Status: AC
  Filled 2022-03-20: qty 2

## 2022-03-20 MED ORDER — STERILE WATER FOR IRRIGATION IR SOLN
Status: DC | PRN
  Administered 2022-03-20 (×2): 3000 mL

## 2022-03-20 SURGICAL SUPPLY — 20 items
BAG DRN RND TRDRP ANRFLXCHMBR (UROLOGICAL SUPPLIES) ×1
BAG URINE DRAIN 2000ML AR STRL (UROLOGICAL SUPPLIES) ×2 IMPLANT
BAG URO CATCHER STRL LF (MISCELLANEOUS) ×2 IMPLANT
CATH FOLEY 3WAY 30CC 22FR (CATHETERS) IMPLANT
DRAPE FOOT SWITCH (DRAPES) ×2 IMPLANT
GLOVE SURG LX STRL 7.5 STRW (GLOVE) ×2 IMPLANT
GOWN STRL REUS W/ TWL LRG LVL3 (GOWN DISPOSABLE) ×2 IMPLANT
GOWN STRL REUS W/ TWL XL LVL3 (GOWN DISPOSABLE) ×2 IMPLANT
GOWN STRL REUS W/TWL LRG LVL3 (GOWN DISPOSABLE) ×1
GOWN STRL REUS W/TWL XL LVL3 (GOWN DISPOSABLE) ×1
HOLDER FOLEY CATH W/STRAP (MISCELLANEOUS) IMPLANT
LOOP CUT BIPOLAR 24F LRG (ELECTROSURGICAL) IMPLANT
MANIFOLD NEPTUNE II (INSTRUMENTS) ×2 IMPLANT
PACK CYSTO (CUSTOM PROCEDURE TRAY) ×2 IMPLANT
PENCIL SMOKE EVACUATOR (MISCELLANEOUS) IMPLANT
PLUG CATH AND CAP STER (CATHETERS) IMPLANT
SYR TOOMEY IRRIG 70ML (MISCELLANEOUS) ×1
SYRINGE TOOMEY IRRIG 70ML (MISCELLANEOUS) ×2 IMPLANT
TUBING CONNECTING 10 (TUBING) ×2 IMPLANT
TUBING UROLOGY SET (TUBING) ×2 IMPLANT

## 2022-03-20 NOTE — Discharge Instructions (Addendum)

## 2022-03-20 NOTE — Op Note (Signed)
Preoperative diagnosis:  1. Hematuria 2. Clot retention  Postoperative diagnosis: 1. same  Procedure(s): 1. Cysto with clot evac, fulguration  Surgeon: Dr. Donald Pore  Anesthesia: general  Complications: none  EBL: ~300 cc of clot evacuated  Drain: 22 Fr 3 way catheter with 30 cc in balloon  Specimens: none  Disposition of specimens: n/a  Intraoperative findings: ~300 cc of clot evacuated. Small bleeders adjacent to TURP defect  Description of procedure:  With appropriate consent having been obtained, the patient was brought to the operative suite where anesthesia was induced. The patient was prepped and draped in the usual sterile fashion and timeout was performed  I carefully inserted the 21 Fr cystoscope. The anterior urethra was unremarkable. There was a TURP defect consistent with a recent procedure, and I did identify a few small bleeders within the defect. I used an Ellik to evacuate clots - around 300 cc of clot was evacuated. With the clots clear, I inspected the bladder and it was unremarkable. I used the bugbee to fulgurate the aforementioned bleeders. Thereafter, the urine irrigated clear through the ellik.  I withdrew the cystoscope and inserted a 22 fr foley catheter. The balloon was inflated with 30 cc. I hand irrigated with return of clear urine.  Donald Pore MD 03/20/2022, 6:44 PM  [facility]  Pager: [phone]

## 2022-03-20 NOTE — Transfer of Care (Signed)
Immediate Anesthesia Transfer of Care Note  Patient: Brad Davis  Procedure(s) Performed: cystoscopy clot evacuation and fulguration (Bladder)  Patient Location: PACU  Anesthesia Type:General  Level of Consciousness: drowsy  Airway & Oxygen Therapy: Patient Spontanous Breathing and Patient connected to face mask oxygen  Post-op Assessment: Report given to RN and Post -op Vital signs reviewed and stable  Post vital signs: Reviewed and stable  Last Vitals:  Vitals Value Taken Time  BP 106/63 03/20/22 1846  Temp    Pulse 82 03/20/22 1847  Resp 9 03/20/22 1847  SpO2 99 % 03/20/22 1847  Vitals shown include unvalidated device data.  Last Pain:  Vitals:   03/20/22 1627  TempSrc: Oral  PainSc: 0-No pain         Complications: No notable events documented.

## 2022-03-20 NOTE — Anesthesia Preprocedure Evaluation (Addendum)
Anesthesia Evaluation  Patient identified by MRN, date of birth, ID band Patient awake    Reviewed: Allergy & Precautions, H&P , NPO status , Patient's Chart, lab work & pertinent test results  Airway Mallampati: I  TM Distance: >3 FB Neck ROM: full    Dental no notable dental hx. (+) Teeth Intact, Dental Advidsory Given   Pulmonary neg pulmonary ROS   Pulmonary exam normal breath sounds clear to auscultation       Cardiovascular negative cardio ROS Normal cardiovascular exam Rhythm:Regular Rate:Normal     Neuro/Psych negative neurological ROS  negative psych ROS   GI/Hepatic negative GI ROS, Neg liver ROS,,,  Endo/Other  negative endocrine ROS    Renal/GU negative Renal ROS  negative genitourinary   Musculoskeletal negative musculoskeletal ROS (+) Arthritis , Osteoarthritis,    Abdominal Normal abdominal exam  (+)   Peds negative pediatric ROS (+)  Hematology negative hematology ROS (+)   Anesthesia Other Findings   Reproductive/Obstetrics negative OB ROS                             Anesthesia Physical Anesthesia Plan  ASA: 2 and emergent  Anesthesia Plan: General   Post-op Pain Management: Ofirmev IV (intra-op)*   Induction: Intravenous  PONV Risk Score and Plan: 2 and Ondansetron, Midazolam and Treatment may vary due to age or medical condition  Airway Management Planned: LMA  Additional Equipment: None  Intra-op Plan:   Post-operative Plan: Extubation in OR  Informed Consent: I have reviewed the patients History and Physical, chart, labs and discussed the procedure including the risks, benefits and alternatives for the proposed anesthesia with the patient or authorized representative who has indicated his/her understanding and acceptance.     Dental advisory given  Plan Discussed with: CRNA, Anesthesiologist and Surgeon  Anesthesia Plan Comments:          Anesthesia Quick Evaluation

## 2022-03-20 NOTE — H&P (Signed)
H&P  History of Present Illness: Brad Davis is a 66 y.o. year old who presents for cysto with clot evac and fulguration for hematuria with clot retention.  Past Medical History:  Diagnosis Date   Carcinoma ([place])    right chest small area to be removed jan  2024   DJD (degenerative joint disease)    History of COVID-19 04/2020   mild symptoms all resolved   Polycythemia    giving blood 02-07-2021 at [facility] for, gives blood q month for    Past Surgical History:  Procedure Laterality Date    right elbow bursa sack repair -triceps strengthening   12/2020   ABSCESS DRAINAGE  05/04/2006   buttock   APPENDECTOMY  05/04/2004   open   GREAT TOE ARTHRODESIS, INTERPHALANGEAL JOINT     rt 30 yrs ago   KNEE ARTHROSCOPY  1983   rt   left rotator cuff  08/2020   LUMBAR LAMINECTOMY  05/05/1995   MASS EXCISION Right 12/14/2013   Procedure: MINOR EXCISION OF MASS;  Surgeon: Ninetta Lights, MD;  Location: [place];  Service: Orthopedics;  Laterality: Right;   right shoulder cyst removed  2016   SHOULDER ARTHROSCOPY  05/04/1986   left   SHOULDER ARTHROSCOPY WITH ROTATOR CUFF REPAIR AND SUBACROMIAL DECOMPRESSION Left 10/20/2012   Procedure: LEFT SHOULDER ARTHROSCOPY , DISTAL CLAVICULECTOMY, EXTENSIVE DEBRIDEMENT, WITH SUBACROMIAL DECOMPRESSION, DEBRIDE LABRUM ROTATOR CUFF;  Surgeon: Ninetta Lights, MD;  Location: [place];  Service: Orthopedics;  Laterality: Left;   TRANSURETHRAL RESECTION OF PROSTATE N/A 02/12/2022   Procedure: TRANSURETHRAL RESECTION OF THE PROSTATE (TURP);  Surgeon: Ardis Hughs, MD;  Location: [facility];  Service: Urology;  Laterality: N/A;  100 MINUTES NEEDED FOR CASE    Home Medications:  Current Meds  Medication Sig   Ascorbic Acid (VITAMIN C WITH ROSE HIPS) 1000 MG tablet Take 1,000 mg by mouth 2 (two) times daily.   meloxicam (MOBIC) 15 MG tablet Take 15 mg by mouth daily.   Multiple  Vitamins-Minerals (MULTIVITAMIN WITH MINERALS) tablet Take 1 tablet by mouth 2 (two) times daily.   NANDROLONE DECANOATE IJ Inject 210 mg as directed once a week.   OVER THE COUNTER MEDICATION Whey protein powder 2 scoops  (  50 mg)qhs   PRESCRIPTION MEDICATION Inject 5 Units into the skin daily. HGH   PRESCRIPTION MEDICATION Take 25 mg by mouth daily. Medication: Methandrostolone   tadalafil (CIALIS) 10 MG tablet Take 30 mg by mouth daily.   UNABLE TO FIND Testosterone 250 mg injection Sunday and thursday   [DISCONTINUED] Creatinine POWD 1 packet by Does not apply route.    Allergies:  Allergies  Allergen Reactions   Chlorhexidine Itching    Pt states when used wipes in past caused severe itching    History reviewed. No pertinent family history.  Social History:  reports that he has never smoked. He has never used smokeless tobacco. He reports that he does not drink alcohol and does not use drugs.  ROS: A complete review of systems was performed.  All systems are negative except for pertinent findings as noted.  Physical Exam:  Vital signs in last 24 hours: Temp:  [97.6 F (36.4 C)-97.8 F (36.6 C)] 97.6 F (36.4 C) (11/17 1627) Pulse Rate:  [81-95] 95 (11/17 1627) Resp:  [15-16] 16 (11/17 1627) BP: (129-135)/(83-89) 129/83 (11/17 1627) SpO2:  [96 %-98 %] 96 % (11/17 1627) Weight:  [87.6 kg-93 kg] 87.6  kg (11/17 1627) Constitutional:  Alert and oriented, No acute distress Cardiovascular: Regular rate and rhythm, No JVD Respiratory: Normal respiratory effort, Lungs clear bilaterally GI: Abdomen is soft, nontender, nondistended, no abdominal masses GU: No CVA tenderness, foley draining dark red Lymphatic: No lymphadenopathy Neurologic: Grossly intact, no focal deficits Psychiatric: Normal mood and affect   Laboratory Data:  Recent Labs    03/19/22 2102  WBC 8.7  HGB 14.5  HCT 43.7  PLT 207    Recent Labs    03/19/22 2102  NA 138  K 3.7  CL 108  GLUCOSE 109*   BUN 37*  CALCIUM 9.0  CREATININE 1.18     Results for orders placed or performed during the hospital encounter of 03/19/22 (from the past 24 hour(s))  Basic metabolic panel     Status: Abnormal   Collection Time: 03/19/22  9:02 PM  Result Value Ref Range   Sodium 138 135 - 145 mmol/L   Potassium 3.7 3.5 - 5.1 mmol/L   Chloride 108 98 - 111 mmol/L   CO2 22 22 - 32 mmol/L   Glucose, Bld 109 (H) 70 - 99 mg/dL   BUN 37 (H) 8 - 23 mg/dL   Creatinine, Ser 1.18 0.61 - 1.24 mg/dL   Calcium 9.0 8.9 - 10.3 mg/dL   GFR, Estimated >60 >60 mL/min   Anion gap 8 5 - 15  CBC     Status: None   Collection Time: 03/19/22  9:02 PM  Result Value Ref Range   WBC 8.7 4.0 - 10.5 K/uL   RBC 5.00 4.22 - 5.81 MIL/uL   Hemoglobin 14.5 13.0 - 17.0 g/dL   HCT 43.7 39.0 - 52.0 %   MCV 87.4 80.0 - 100.0 fL   MCH 29.0 26.0 - 34.0 pg   MCHC 33.2 30.0 - 36.0 g/dL   RDW 14.6 11.5 - 15.5 %   Platelets 207 150 - 400 K/uL   nRBC 0.0 0.0 - 0.2 %  Urinalysis, Routine w reflex microscopic     Status: Abnormal   Collection Time: 03/19/22 10:06 PM  Result Value Ref Range   Color, Urine RED (A) YELLOW   APPearance CLOUDY (A) CLEAR   Specific Gravity, Urine <1.005 (L) 1.005 - 1.030   pH  5.0 - 8.0    TEST NOT REPORTED DUE TO COLOR INTERFERENCE OF URINE PIGMENT   Glucose, UA (A) NEGATIVE mg/dL    TEST NOT REPORTED DUE TO COLOR INTERFERENCE OF URINE PIGMENT   Hgb urine dipstick (A) NEGATIVE    TEST NOT REPORTED DUE TO COLOR INTERFERENCE OF URINE PIGMENT   Bilirubin Urine (A) NEGATIVE    TEST NOT REPORTED DUE TO COLOR INTERFERENCE OF URINE PIGMENT   Ketones, ur (A) NEGATIVE mg/dL    TEST NOT REPORTED DUE TO COLOR INTERFERENCE OF URINE PIGMENT   Protein, ur (A) NEGATIVE mg/dL    TEST NOT REPORTED DUE TO COLOR INTERFERENCE OF URINE PIGMENT   Nitrite (A) NEGATIVE    TEST NOT REPORTED DUE TO COLOR INTERFERENCE OF URINE PIGMENT   Leukocytes,Ua (A) NEGATIVE    TEST NOT REPORTED DUE TO COLOR INTERFERENCE OF URINE  PIGMENT  Urinalysis, Microscopic (reflex)     Status: Abnormal   Collection Time: 03/19/22 10:06 PM  Result Value Ref Range   RBC / HPF >50 0 - 5 RBC/hpf   WBC, UA 6-10 0 - 5 WBC/hpf   Bacteria, UA FEW (A) NONE SEEN   Squamous Epithelial / LPF 0-5 0 -  5   No results found for this or any previous visit (from the past 240 hour(s)).  Renal Function: Recent Labs    03/19/22 2102  CREATININE 1.18   Estimated Creatinine Clearance: 68.4 mL/min (by C-G formula based on SCr of 1.18 mg/dL).  Radiologic Imaging: No results found.  Assessment:  66 yo M with hematuria, clot retention ~5 weeks s/p TURP  Plan:  To OR as planned. Risks and procedure reviewed in detail.  Donald Pore, MD 03/20/2022, 4:36 PM  [facility] Pager: [phone]

## 2022-03-20 NOTE — Anesthesia Procedure Notes (Signed)
Procedure Name: LMA Insertion Date/Time: 03/20/2022 6:13 PM  Performed by: Gean Maidens, CRNAPre-anesthesia Checklist: Patient identified, Emergency Drugs available, Suction available, Patient being monitored and Timeout performed Patient Re-evaluated:Patient Re-evaluated prior to induction Oxygen Delivery Method: Circle system utilized Preoxygenation: Pre-oxygenation with 100% oxygen Induction Type: IV induction Ventilation: Mask ventilation without difficulty LMA: LMA inserted LMA Size: 4.0 Number of attempts: 1 Placement Confirmation: positive ETCO2 and breath sounds checked- equal and bilateral Tube secured with: Tape Dental Injury: Teeth and Oropharynx as per pre-operative assessment

## 2022-03-21 NOTE — Anesthesia Postprocedure Evaluation (Signed)
Anesthesia Post Note  Patient: Brad Davis  Procedure(s) Performed: cystoscopy clot evacuation and fulguration (Bladder)     Patient location during evaluation: PACU Anesthesia Type: General Level of consciousness: awake and alert Pain management: pain level controlled Vital Signs Assessment: post-procedure vital signs reviewed and stable Respiratory status: spontaneous breathing, nonlabored ventilation, respiratory function stable and patient connected to nasal cannula oxygen Cardiovascular status: blood pressure returned to baseline and stable Postop Assessment: no apparent nausea or vomiting Anesthetic complications: no  No notable events documented.  Last Vitals:  Vitals:   03/20/22 2000 03/20/22 2034  BP: 119/77 127/75  Pulse: 81 90  Resp:  16  Temp:  36.7 C  SpO2: 98% 96%    Last Pain:  Vitals:   03/20/22 2034  TempSrc:   PainSc: 0-No pain                 Son Barkan

## 2022-03-21 NOTE — Anesthesia Postprocedure Evaluation (Signed)
Anesthesia Post Note  Patient: Brad Davis  Procedure(s) Performed: cystoscopy clot evacuation and fulguration (Bladder)     Patient location during evaluation: PACU Anesthesia Type: General Level of consciousness: awake and alert Pain management: pain level controlled Vital Signs Assessment: post-procedure vital signs reviewed and stable Respiratory status: spontaneous breathing, nonlabored ventilation, respiratory function stable and patient connected to nasal cannula oxygen Cardiovascular status: blood pressure returned to baseline and stable Postop Assessment: no apparent nausea or vomiting Anesthetic complications: no  No notable events documented.  Last Vitals:  Vitals:   03/20/22 2000 03/20/22 2034  BP: 119/77 127/75  Pulse: 81 90  Resp:  16  Temp:  36.7 C  SpO2: 98% 96%    Last Pain:  Vitals:   03/20/22 2034  TempSrc:   PainSc: 0-No pain                 Meggen Spaziani

## 2022-03-22 ENCOUNTER — Encounter (HOSPITAL_COMMUNITY): Payer: Self-pay | Admitting: Urology

## 2022-03-24 ENCOUNTER — Observation Stay (HOSPITAL_COMMUNITY)
Admission: EM | Admit: 2022-03-24 | Discharge: 2022-03-24 | Disposition: A | Discharge status: Home / Self Care | Payer: Medicare Other | Attending: Urology | Admitting: Urology

## 2022-03-24 ENCOUNTER — Encounter (HOSPITAL_COMMUNITY): Payer: Self-pay

## 2022-03-24 ENCOUNTER — Encounter (HOSPITAL_COMMUNITY)
Admission: EM | Disposition: A | Discharge status: Home / Self Care | Payer: Self-pay | Source: Home / Self Care | Attending: Emergency Medicine

## 2022-03-24 ENCOUNTER — Other Ambulatory Visit: Payer: Self-pay

## 2022-03-24 ENCOUNTER — Emergency Department (HOSPITAL_BASED_OUTPATIENT_CLINIC_OR_DEPARTMENT_OTHER): Payer: Medicare Other | Admitting: Certified Registered Nurse Anesthetist

## 2022-03-24 ENCOUNTER — Emergency Department (HOSPITAL_COMMUNITY): Payer: Medicare Other | Admitting: Certified Registered Nurse Anesthetist

## 2022-03-24 DIAGNOSIS — Z85828 Personal history of other malignant neoplasm of skin: Secondary | ICD-10-CM | POA: Diagnosis not present

## 2022-03-24 DIAGNOSIS — R319 Hematuria, unspecified: Secondary | ICD-10-CM | POA: Diagnosis present

## 2022-03-24 DIAGNOSIS — R338 Other retention of urine: Secondary | ICD-10-CM | POA: Insufficient documentation

## 2022-03-24 DIAGNOSIS — R339 Retention of urine, unspecified: Secondary | ICD-10-CM

## 2022-03-24 DIAGNOSIS — R310 Gross hematuria: Secondary | ICD-10-CM | POA: Diagnosis present

## 2022-03-24 DIAGNOSIS — Z8616 Personal history of COVID-19: Secondary | ICD-10-CM | POA: Insufficient documentation

## 2022-03-24 HISTORY — PX: CYSTOSCOPY WITH FULGERATION: SHX6638

## 2022-03-24 LAB — CBC WITH DIFFERENTIAL/PLATELET
Abs Immature Granulocytes: 0.12 10*3/uL — ABNORMAL HIGH (ref 0.00–0.07)
Basophils Absolute: 0.1 10*3/uL (ref 0.0–0.1)
Basophils Relative: 1 %
Eosinophils Absolute: 0.3 10*3/uL (ref 0.0–0.5)
Eosinophils Relative: 2 %
HCT: 45 % (ref 39.0–52.0)
Hemoglobin: 14.3 g/dL (ref 13.0–17.0)
Immature Granulocytes: 1 %
Lymphocytes Relative: 40 %
Lymphs Abs: 5.2 10*3/uL — ABNORMAL HIGH (ref 0.7–4.0)
MCH: 28.9 pg (ref 26.0–34.0)
MCHC: 31.8 g/dL (ref 30.0–36.0)
MCV: 90.9 fL (ref 80.0–100.0)
Monocytes Absolute: 1.2 10*3/uL — ABNORMAL HIGH (ref 0.1–1.0)
Monocytes Relative: 9 %
Neutro Abs: 6.2 10*3/uL (ref 1.7–7.7)
Neutrophils Relative %: 47 %
Platelets: 290 10*3/uL (ref 150–400)
RBC: 4.95 MIL/uL (ref 4.22–5.81)
RDW: 14.3 % (ref 11.5–15.5)
WBC: 13.1 10*3/uL — ABNORMAL HIGH (ref 4.0–10.5)
nRBC: 0 % (ref 0.0–0.2)

## 2022-03-24 LAB — BASIC METABOLIC PANEL
Anion gap: 12 (ref 5–15)
BUN: 39 mg/dL — ABNORMAL HIGH (ref 8–23)
CO2: 21 mmol/L — ABNORMAL LOW (ref 22–32)
Calcium: 8.5 mg/dL — ABNORMAL LOW (ref 8.9–10.3)
Chloride: 106 mmol/L (ref 98–111)
Creatinine, Ser: 1.09 mg/dL (ref 0.61–1.24)
GFR, Estimated: >60 mL/min (ref >60)
Glucose, Bld: 144 mg/dL — ABNORMAL HIGH (ref 70–99)
Potassium: 3.9 mmol/L (ref 3.5–5.1)
Sodium: 139 mmol/L (ref 135–145)

## 2022-03-24 LAB — HIV ANTIBODY (ROUTINE TESTING W REFLEX): HIV Screen 4th Generation wRfx: NONREACTIVE

## 2022-03-24 SURGERY — CYSTOSCOPY, WITH BLADDER FULGURATION
Anesthesia: General | Site: Urethra

## 2022-03-24 MED ORDER — SODIUM CHLORIDE 0.9 % IR SOLN
3000.0000 mL | Status: DC
  Administered 2022-03-24: 3000 mL

## 2022-03-24 MED ORDER — FENTANYL CITRATE PF 50 MCG/ML IJ SOSY: 25.0000 ug | PREFILLED_SYRINGE | INTRAMUSCULAR | Status: DC | PRN

## 2022-03-24 MED ORDER — MIDAZOLAM HCL 2 MG/2ML IJ SOLN
INTRAMUSCULAR | Status: AC
  Filled 2022-03-24: qty 2

## 2022-03-24 MED ORDER — SULFAMETHOXAZOLE-TRIMETHOPRIM 800-160 MG PO TABS
1.0000 | ORAL_TABLET | Freq: Two times a day (BID) | ORAL | Status: DC
  Administered 2022-03-24: 1 via ORAL
  Filled 2022-03-24: qty 1

## 2022-03-24 MED ORDER — 0.9 % SODIUM CHLORIDE (POUR BTL) OPTIME
TOPICAL | Status: DC | PRN
  Administered 2022-03-24: 1000 mL

## 2022-03-24 MED ORDER — HYDROMORPHONE HCL 1 MG/ML IJ SOLN
1.0000 mg | Freq: Once | INTRAMUSCULAR | Status: AC
  Administered 2022-03-24: 1 mg via INTRAVENOUS

## 2022-03-24 MED ORDER — OXYCODONE HCL 5 MG PO TABS: 5.0000 mg | ORAL_TABLET | Freq: Once | ORAL | Status: DC | PRN

## 2022-03-24 MED ORDER — OXYCODONE HCL 5 MG/5ML PO SOLN: 5.0000 mg | Freq: Once | ORAL | Status: DC | PRN

## 2022-03-24 MED ORDER — MIDAZOLAM HCL 5 MG/5ML IJ SOLN
INTRAMUSCULAR | Status: DC | PRN
  Administered 2022-03-24: 2 mg via INTRAVENOUS

## 2022-03-24 MED ORDER — DIPHENHYDRAMINE HCL 12.5 MG/5ML PO ELIX: 12.5000 mg | ORAL_SOLUTION | Freq: Four times a day (QID) | ORAL | Status: DC | PRN

## 2022-03-24 MED ORDER — OXYBUTYNIN CHLORIDE 5 MG PO TABS: 5.0000 mg | ORAL_TABLET | Freq: Three times a day (TID) | ORAL | Status: DC | PRN

## 2022-03-24 MED ORDER — LACTATED RINGERS IV SOLN: INTRAVENOUS | Status: DC | PRN

## 2022-03-24 MED ORDER — FENTANYL CITRATE (PF) 100 MCG/2ML IJ SOLN
INTRAMUSCULAR | Status: AC
  Filled 2022-03-24: qty 2

## 2022-03-24 MED ORDER — ACETAMINOPHEN 325 MG PO TABS: 650.0000 mg | ORAL_TABLET | ORAL | Status: DC | PRN

## 2022-03-24 MED ORDER — SODIUM CHLORIDE 0.9 % IV SOLN
INTRAVENOUS | Status: AC
  Filled 2022-03-24: qty 20

## 2022-03-24 MED ORDER — DEXTROSE 5 % IV SOLN
INTRAVENOUS | Status: DC | PRN
  Administered 2022-03-24: 2 g via INTRAVENOUS

## 2022-03-24 MED ORDER — ONDANSETRON HCL 4 MG/2ML IJ SOLN: 4.0000 mg | Freq: Once | INTRAMUSCULAR | Status: DC | PRN

## 2022-03-24 MED ORDER — SODIUM CHLORIDE 0.9 % IR SOLN
Status: DC | PRN
  Administered 2022-03-24: 21000 mL

## 2022-03-24 MED ORDER — DIPHENHYDRAMINE HCL 50 MG/ML IJ SOLN: 12.5000 mg | Freq: Four times a day (QID) | INTRAMUSCULAR | Status: DC | PRN

## 2022-03-24 MED ORDER — HYDROCODONE-ACETAMINOPHEN 5-325 MG PO TABS: 1.0000 | ORAL_TABLET | ORAL | Status: DC | PRN

## 2022-03-24 MED ORDER — PHENYLEPHRINE 80 MCG/ML (10ML) SYRINGE FOR IV PUSH (FOR BLOOD PRESSURE SUPPORT)
PREFILLED_SYRINGE | INTRAVENOUS | Status: DC | PRN
  Administered 2022-03-24 (×2): 160 ug via INTRAVENOUS
  Administered 2022-03-24 (×2): 80 ug via INTRAVENOUS

## 2022-03-24 MED ORDER — ONDANSETRON HCL 4 MG/2ML IJ SOLN
4.0000 mg | Freq: Once | INTRAMUSCULAR | Status: AC
  Administered 2022-03-24: 4 mg via INTRAVENOUS
  Filled 2022-03-24: qty 2

## 2022-03-24 MED ORDER — STERILE WATER FOR IRRIGATION IR SOLN
Status: DC | PRN
  Administered 2022-03-24: 30 mL

## 2022-03-24 MED ORDER — DEXAMETHASONE SODIUM PHOSPHATE 10 MG/ML IJ SOLN
INTRAMUSCULAR | Status: DC | PRN
  Administered 2022-03-24: 10 mg via INTRAVENOUS

## 2022-03-24 MED ORDER — SODIUM CHLORIDE 0.9 % IV SOLN: INTRAVENOUS | Status: DC

## 2022-03-24 MED ORDER — HYDROMORPHONE HCL 1 MG/ML IJ SOLN: 0.5000 mg | INTRAMUSCULAR | Status: DC | PRN

## 2022-03-24 MED ORDER — ONDANSETRON HCL 4 MG/2ML IJ SOLN: 4.0000 mg | INTRAMUSCULAR | Status: DC | PRN

## 2022-03-24 MED ORDER — HYDROMORPHONE HCL 1 MG/ML IJ SOLN
1.0000 mg | Freq: Once | INTRAMUSCULAR | Status: AC
  Administered 2022-03-24: 1 mg via INTRAVENOUS
  Filled 2022-03-24: qty 1

## 2022-03-24 MED ORDER — LIDOCAINE 2% (20 MG/ML) 5 ML SYRINGE
INTRAMUSCULAR | Status: DC | PRN
  Administered 2022-03-24: 60 mg via INTRAVENOUS

## 2022-03-24 MED ORDER — PROPOFOL 10 MG/ML IV BOLUS
INTRAVENOUS | Status: DC | PRN
  Administered 2022-03-24: 160 mg via INTRAVENOUS

## 2022-03-24 MED ORDER — PROPOFOL 10 MG/ML IV BOLUS
INTRAVENOUS | Status: AC
  Filled 2022-03-24: qty 20

## 2022-03-24 MED ORDER — SUCCINYLCHOLINE CHLORIDE 200 MG/10ML IV SOSY
PREFILLED_SYRINGE | INTRAVENOUS | Status: DC | PRN
  Administered 2022-03-24: 140 mg via INTRAVENOUS

## 2022-03-24 MED ORDER — FENTANYL CITRATE (PF) 100 MCG/2ML IJ SOLN
INTRAMUSCULAR | Status: DC | PRN
  Administered 2022-03-24: 25 ug via INTRAVENOUS

## 2022-03-24 MED ORDER — ONDANSETRON HCL 4 MG/2ML IJ SOLN
INTRAMUSCULAR | Status: DC | PRN
  Administered 2022-03-24: 4 mg via INTRAVENOUS

## 2022-03-24 MED ORDER — EPHEDRINE SULFATE-NACL 50-0.9 MG/10ML-% IV SOSY
PREFILLED_SYRINGE | INTRAVENOUS | Status: DC | PRN
  Administered 2022-03-24: 10 mg via INTRAVENOUS

## 2022-03-24 MED ORDER — HYDROMORPHONE HCL 1 MG/ML IJ SOLN
1.0000 mg | Freq: Once | INTRAMUSCULAR | Status: DC
  Filled 2022-03-24: qty 1

## 2022-03-24 SURGICAL SUPPLY — 24 items
BAG DRN RND TRDRP ANRFLXCHMBR (UROLOGICAL SUPPLIES) ×1
BAG URINE DRAIN 2000ML AR STRL (UROLOGICAL SUPPLIES) IMPLANT
BAG URO CATCHER STRL LF (MISCELLANEOUS) ×2 IMPLANT
CATH FOLEY 3WAY 30CC 24FR (CATHETERS) ×1
CATH URTH STD 24FR FL 3W 2 (CATHETERS) IMPLANT
DRAPE FOOT SWITCH (DRAPES) ×2 IMPLANT
ELECT REM PT RETURN 15FT ADLT (MISCELLANEOUS) ×2 IMPLANT
EVACUATOR MICROVAS BLADDER (UROLOGICAL SUPPLIES) IMPLANT
GLOVE BIO SURGEON STRL SZ8 (GLOVE) IMPLANT
GLOVE BIOGEL PI IND STRL 8 (GLOVE) IMPLANT
GLOVE SURG LX STRL 7.5 STRW (GLOVE) ×2 IMPLANT
GOWN STRL REUS W/ TWL XL LVL3 (GOWN DISPOSABLE) ×2 IMPLANT
GOWN STRL REUS W/TWL XL LVL3 (GOWN DISPOSABLE) ×1 IMPLANT
HOLDER FOLEY CATH W/STRAP (MISCELLANEOUS) IMPLANT
KIT TURNOVER KIT A (KITS) IMPLANT
LOOP CUT BIPOLAR 24F LRG (ELECTROSURGICAL) IMPLANT
MANIFOLD NEPTUNE II (INSTRUMENTS) ×2 IMPLANT
PACK CYSTO (CUSTOM PROCEDURE TRAY) ×2 IMPLANT
PENCIL SMOKE EVACUATOR (MISCELLANEOUS) IMPLANT
SYR 30ML LL (SYRINGE) IMPLANT
SYR TOOMEY IRRIG 70ML (MISCELLANEOUS)
SYRINGE TOOMEY IRRIG 70ML (MISCELLANEOUS) IMPLANT
TUBING CONNECTING 10 (TUBING) ×2 IMPLANT
TUBING UROLOGY SET (TUBING) ×2 IMPLANT

## 2022-03-24 NOTE — Op Note (Addendum)
Operative Note  Preoperative diagnosis:  1.  Clot retention  Postoperative diagnosis: 1.  Clot retention  Procedure(s): 1.  Cystoscopy with clot evacuation and fulguration  Surgeon: Ellison Hughs, MD  Assistants:  None  Anesthesia:  General  Complications:  None  EBL: 1 L of clot evacuated  Specimens: 1.  None  Drains/Catheters: 1.  24 French three-way Foley catheter with 30 mill of sterile water in the balloon  Intraoperative findings:   Large clot burden within the bladder Generalized bleeding from the prostate resection bed Prominent vasculature seen throughout the bladder mucosa  Indication:  Brad Davis is a 66 y.o. male with clot urinary retention following TURP in October with subsequent cystoscopy and clot evacuation on 02/17/2022.  He presented back to the emergency room on 03/23/2022 with gross hematuria and incomplete bladder emptying.  He has been consented for the above procedures, voices understanding and wishes to proceed.  Description of procedure:  After informed consent was obtained, the patient was brought to the operating room and general LMA anesthesia was administered. The patient was then placed in the dorsolithotomy position and prepped and draped in the usual sterile fashion. A timeout was performed.  A 26 French resectoscope was then inserted into the urethra meatus and advanced into the bladder.  A complete bladder survey revealed significant clot burden within the bladder as well as generalized bleeding from the prostate resection bed.  There were also numerous prominent veins throughout the bladder mucosa.  All clot was evacuated from the bladder, totaling almost a liter.  I then extensively fulgurated the prostate resection bed and the prominent veins seen throughout the bladder achieving excellent hemostasis.  A 24 French three-way Foley catheter was then inserted with return of clear irrigant.  The catheter was then placed to continuous  bladder irrigation and gravity drainage.  He tolerated the procedure well and was transferred to the postanesthesia in stable condition.  Plan: CBI in PACU

## 2022-03-24 NOTE — Transfer of Care (Signed)
Immediate Anesthesia Transfer of Care Note  Patient: Brad Davis  Procedure(s) Performed: CYSTOSCOPY, CLOT EVACUATION WITH FULGERATION (Urethra)  Patient Location: PACU  Anesthesia Type:General  Level of Consciousness: sedated, patient cooperative, and responds to stimulation  Airway & Oxygen Therapy: Patient Spontanous Breathing and Patient connected to face mask oxygen  Post-op Assessment: Report given to RN and Post -op Vital signs reviewed and stable  Post vital signs: Reviewed and stable  Last Vitals:  Vitals Value Taken Time  BP 122/74 03/24/22 0412  Temp    Pulse 99 03/24/22 0414  Resp 14 03/24/22 0414  SpO2 100 % 03/24/22 0414  Vitals shown include unvalidated device data.  Last Pain:  Vitals:   03/24/22 0114  PainSc: 10-Worst pain ever      Patients Stated Pain Goal: 0 (28/83/37 4451)  Complications: No notable events documented.

## 2022-03-24 NOTE — Anesthesia Procedure Notes (Signed)
Procedure Name: Intubation Date/Time: 03/24/2022 3:20 AM  Performed by: Gean Maidens, CRNAPre-anesthesia Checklist: Patient identified, Emergency Drugs available, Suction available, Patient being monitored and Timeout performed Patient Re-evaluated:Patient Re-evaluated prior to induction Oxygen Delivery Method: Circle system utilized Preoxygenation: Pre-oxygenation with 100% oxygen Induction Type: IV induction and Rapid sequence Laryngoscope Size: Mac and 4 Grade View: Grade I Tube type: Oral Tube size: 7.5 mm Number of attempts: 1 Airway Equipment and Method: Stylet Placement Confirmation: ETT inserted through vocal cords under direct vision, positive ETCO2 and breath sounds checked- equal and bilateral Secured at: 22 cm Tube secured with: Tape Dental Injury: Teeth and Oropharynx as per pre-operative assessment

## 2022-03-24 NOTE — Progress Notes (Signed)
Pt. Discharged ambulatory,with FC. Pt. Is alert and oriented no distress. Discharge instruction and education discussed with patient. All personal belongings are with the family.

## 2022-03-24 NOTE — Progress Notes (Signed)
  Transition of Care [facility]) Screening Note   Patient Details  Name: Sebastin Perlmutter Navis Date of Birth: 10-19-1955   Transition of Care [facility]) CM/SW Contact:    Vassie Moselle, [place] Phone Number: 03/24/2022, 10:52 AM    Transition of Care Department [facility]) has reviewed patient and no TOC needs have been identified at this time. We will continue to monitor patient advancement through interdisciplinary progression rounds. If new patient transition needs arise, please place a TOC consult.

## 2022-03-24 NOTE — ED Triage Notes (Signed)
Sts large amount of bright red blood today after having catheter removed.   Had a TURP 5 weeks ago and went to office today for foley to be removed.

## 2022-03-24 NOTE — Discharge Summary (Signed)
Date of admission: 03/24/2022  Date of discharge: 03/24/2022  Admission diagnosis: Gross hematuria with clot retention  Discharge diagnosis: Same  Procedures: Cystoscopy with clot evacuation and fulguration  History and Physical: For full details, please see admission history and physical. Briefly, Brad Davis is a 66 y.o. year old patient with clot retention that required cystoscopy with clot evacuation on 03/20/2022 and 03/23/2022.  He is s/p TURP with Dr. Louis Meckel in October.  Hospital Course: Postoperatively, the patient was monitored on the floor.  His Foley catheter was draining clear-yellow urine with minimal CBI.  Physical Exam:  General: Alert and oriented CV: RRR, palpable distal pulses Lungs: CTAB, equal chest rise Abdomen: Soft, NTND, no rebound or guarding GU: 24 French three-way Foley catheter in place and draining clear-yellow urine  Laboratory values:  Recent Labs    03/24/22 0129  HGB 14.3  HCT 45.0   Recent Labs    03/24/22 0129  CREATININE 1.09    Disposition: Home  Discharge instruction: The patient was instructed to be ambulatory but told to refrain from heavy lifting, strenuous activity, or driving.  Discharge medications:  Allergies as of 03/24/2022       Reactions   Chlorhexidine Itching   Pt states when used wipes in past caused severe itching        Medication List     TAKE these medications    CIALIS PO Take 30 mg by mouth daily. Liquid compound, unknown dosage form   meloxicam 15 MG tablet Commonly known as: MOBIC Take 15 mg by mouth daily.   multivitamin with minerals tablet Take 2 tablets by mouth daily.   NANDROLONE DECANOATE IJ Inject 210 mg as directed once a week.   OVER THE COUNTER MEDICATION Take 5 mg by mouth daily. Creatinine 5 mg daily   OVER THE COUNTER MEDICATION Take 2 Scoops by mouth at bedtime. Whey protein powder 2 scoops   phenazopyridine 200 MG tablet Commonly known as: Pyridium Take 1 tablet  (200 mg total) by mouth 3 (three) times daily as needed for pain.   PRESCRIPTION MEDICATION Inject 5 Units into the skin daily. Medication: Human Growth hormone   PRESCRIPTION MEDICATION Take 25 mg by mouth daily. Medication: Methandrostolone   sulfamethoxazole-trimethoprim 800-160 MG tablet Commonly known as: BACTRIM DS Take 1 tablet by mouth 2 (two) times daily.   UNABLE TO FIND Inject 250 mg into the skin 2 (two) times a week. Testosterone 250 mg injection   VISINE OP Place 1 drop into both eyes 2 (two) times daily as needed (redness).   vitamin C with rose hips 1000 MG tablet Take 1,000 mg by mouth 2 (two) times daily.        Followup:   Follow-up Information     Ardis Hughs, MD Follow up on 03/30/2022.   Specialty: Urology Contact information: [place] [id number] [phone]

## 2022-03-24 NOTE — ED Provider Notes (Signed)
[facility] DEPT Provider Note   CSN: [id number] Arrival date & time: 03/24/22  0109     History  Chief Complaint  Patient presents with   Urinary Retention    Brad Davis is a 66 y.o. male.  Patient presents to the emergency department for evaluation of urinary retention.  Patient had TURP performed 5 weeks ago.  He had gross hematuria requiring cystoscopy for evacuation of clots on November 17.  Patient had Foley catheter removed today.  He has been urinating bright red blood and tonight cannot pass any urine.  He arrives in severe distress.       Home Medications Prior to Admission medications   Medication Sig Start Date End Date Taking? Authorizing Provider  Ascorbic Acid (VITAMIN C WITH ROSE HIPS) 1000 MG tablet Take 1,000 mg by mouth 2 (two) times daily.    [provider]  meloxicam (MOBIC) 15 MG tablet Take 15 mg by mouth daily.    [provider]  Multiple Vitamins-Minerals (MULTIVITAMIN WITH MINERALS) tablet Take 1 tablet by mouth 2 (two) times daily.    [provider]  NANDROLONE DECANOATE IJ Inject 210 mg as directed once a week.    [provider]  OVER THE COUNTER MEDICATION Creatinine 5 mg daily    [provider]  OVER THE COUNTER MEDICATION Whey protein powder 2 scoops  (  50 mg)qhs    [provider]  phenazopyridine (PYRIDIUM) 200 MG tablet Take 1 tablet (200 mg total) by mouth 3 (three) times daily as needed for pain. Patient not taking: Reported on 03/20/2022 02/13/22   Ardis Hughs, MD  PRESCRIPTION MEDICATION Inject 5 Units into the skin daily. Medication: Human Growth hormone    [provider]  PRESCRIPTION MEDICATION Take 25 mg by mouth daily. Medication: [place]    [provider]  sulfamethoxazole-trimethoprim (BACTRIM DS) 800-160 MG tablet Take 1 tablet by mouth 2 (two) times daily. 03/20/22   Vira Agar, MD  tadalafil  (CIALIS) 10 MG tablet Take 30 mg by mouth daily.    [provider]  UNABLE TO FIND Testosterone 250 mg injection Sunday and thursday    [provider]      Allergies    Chlorhexidine    Review of Systems   Review of Systems  Physical Exam Updated Vital Signs BP (!) 168/133   Pulse 62   Resp (!) 22   SpO2 94%  Physical Exam Vitals and nursing note reviewed.  Constitutional:      General: He is in acute distress.     Appearance: He is well-developed.  HENT:     Head: Normocephalic and atraumatic.     Mouth/Throat:     Mouth: Mucous membranes are moist.  Eyes:     General: Vision grossly intact. Gaze aligned appropriately.     Extraocular Movements: Extraocular movements intact.     Conjunctiva/sclera: Conjunctivae normal.  Cardiovascular:     Rate and Rhythm: Normal rate and regular rhythm.     Pulses: Normal pulses.     Heart sounds: Normal heart sounds, S1 normal and S2 normal. No murmur heard.    No friction rub. No gallop.  Pulmonary:     Effort: Pulmonary effort is normal. No respiratory distress.     Breath sounds: Normal breath sounds.  Abdominal:     General: There is distension.     Palpations: Abdomen is soft.     Tenderness: There is abdominal  tenderness in the suprapubic area. There is no guarding or rebound.     Hernia: No hernia is present.  Musculoskeletal:        General: No swelling.     Cervical back: Full passive range of motion without pain, normal range of motion and neck supple. No pain with movement, spinous process tenderness or muscular tenderness. Normal range of motion.     Right lower leg: No edema.     Left lower leg: No edema.  Skin:    General: Skin is warm and dry.     Capillary Refill: Capillary refill takes less than 2 seconds.     Findings: No ecchymosis, erythema, lesion or wound.  Neurological:     Mental Status: He is alert and oriented to person, place, and time.     GCS: GCS eye subscore is 4. GCS verbal  subscore is 5. GCS motor subscore is 6.     Cranial Nerves: Cranial nerves 2-12 are intact.     Sensory: Sensation is intact.     Motor: Motor function is intact. No weakness or abnormal muscle tone.     Coordination: Coordination is intact.  Psychiatric:        Mood and Affect: Mood normal.        Speech: Speech normal.        Behavior: Behavior normal.     ED Results / Procedures / Treatments   Labs (all labs ordered are listed, but only abnormal results are displayed) Labs Reviewed  CBC WITH DIFFERENTIAL/PLATELET - Abnormal; Notable for the following components:      Result Value   WBC 13.1 (*)    Lymphs Abs 5.2 (*)    Monocytes Absolute 1.2 (*)    Abs Immature Granulocytes 0.12 (*)    All other components within normal limits  BASIC METABOLIC PANEL - Abnormal; Notable for the following components:   CO2 21 (*)    Glucose, Bld 144 (*)    BUN 39 (*)    Calcium 8.5 (*)    All other components within normal limits  URINALYSIS, ROUTINE W REFLEX MICROSCOPIC    EKG None  Radiology No results found.  Procedures Procedures    Medications Ordered in ED Medications  HYDROmorphone (DILAUDID) injection 1 mg (has no administration in time range)  ondansetron (ZOFRAN) injection 4 mg (4 mg Intravenous Given 03/24/22 0132)  HYDROmorphone (DILAUDID) injection 1 mg (1 mg Intravenous Given 03/24/22 0133)    ED Course/ Medical Decision Making/ A&P                           Medical Decision Making Amount and/or Complexity of Data Reviewed Labs: ordered.  Risk Prescription drug management.   Patient arrives with acute urinary retention and gross hematuria.  Nursing staff placed a coud Foley and a large clot came out and then no further drainage.  A 24 French three-way irrigation catheter was placed.  Bladder was manually irrigated.  Patient did not tolerate this because of severe pain and he did not adequately drain.  Discussed with Dr. Lovena Neighbours, on-call for urology.  He will  take the patient to the OR for cystoscopy.        Final Clinical Impression(s) / ED Diagnoses Final diagnoses:  Hematuria, unspecified type    Rx / DC Orders ED Discharge Orders     None         Korayma Hagwood, Gwenyth Allegra, MD 03/24/22 [phone]

## 2022-03-24 NOTE — H&P (Signed)
H&P  Chief Complaint: Blood in the urine  History of Present Illness: Brad Davis is a 66 y.o. year old male status post TURP with Dr. Louis Meckel in October with subsequent clot urinary retention requiring cystoscopy and clot evacuation with Dr. Cain Sieve on 03/20/2022.  The patient presents back to the emergency department with gross hematuria and the sensation of incomplete bladder emptying.  He had a Foley catheter placed in the emergency department and following extensive hand irrigation, his hematuria persisted and his Foley catheter eventually clotted off.  He appears extremely uncomfortable due to the urge to urinate.  Past Medical History:  Diagnosis Date   Carcinoma ([place])    right chest small area to be removed jan  2024   DJD (degenerative joint disease)    History of COVID-19 04/2020   mild symptoms all resolved   Polycythemia    giving blood 02-07-2021 at one blood for, gives blood q month for    Past Surgical History:  Procedure Laterality Date    right elbow bursa sack repair -triceps strengthening   12/2020   ABSCESS DRAINAGE  05/04/2006   buttock   APPENDECTOMY  05/04/2004   open   GREAT TOE ARTHRODESIS, INTERPHALANGEAL JOINT     rt 30 yrs ago   KNEE ARTHROSCOPY  1983   rt   left rotator cuff  08/2020   LUMBAR LAMINECTOMY  05/05/1995   MASS EXCISION Right 12/14/2013   Procedure: MINOR EXCISION OF MASS;  Surgeon: Ninetta Lights, MD;  Location: [facility];  Service: Orthopedics;  Laterality: Right;   right shoulder cyst removed  2016   SHOULDER ARTHROSCOPY  05/04/1986   left   SHOULDER ARTHROSCOPY WITH ROTATOR CUFF REPAIR AND SUBACROMIAL DECOMPRESSION Left 10/20/2012   Procedure: LEFT SHOULDER ARTHROSCOPY , DISTAL CLAVICULECTOMY, EXTENSIVE DEBRIDEMENT, WITH SUBACROMIAL DECOMPRESSION, DEBRIDE LABRUM ROTATOR CUFF;  Surgeon: Ninetta Lights, MD;  Location: [facility];  Service: Orthopedics;  Laterality: Left;   TRANSURETHRAL RESECTION OF  PROSTATE N/A 02/12/2022   Procedure: TRANSURETHRAL RESECTION OF THE PROSTATE (TURP);  Surgeon: Ardis Hughs, MD;  Location: [facility];  Service: Urology;  Laterality: N/A;  100 MINUTES NEEDED FOR CASE   TRANSURETHRAL RESECTION OF PROSTATE N/A 03/20/2022   Procedure: cystoscopy clot evacuation and fulguration;  Surgeon: Vira Agar, MD;  Location: [facility];  Service: Urology;  Laterality: N/A;    Home Medications:  No outpatient medications have been marked as taking for the 03/24/22 encounter [facility] Encounter).    Allergies:  Allergies  Allergen Reactions   Chlorhexidine Itching    Pt states when used wipes in past caused severe itching    History reviewed. No pertinent family history.  Social History:  reports that he has never smoked. He has never used smokeless tobacco. He reports that he does not drink alcohol and does not use drugs.  ROS: A complete review of systems was performed.  All systems are negative except for pertinent findings as noted.  Physical Exam:  Vital signs in last 24 hours: Temp:  [98.2 F (36.8 C)] 98.2 F (36.8 C) (11/21 0258) Pulse Rate:  [62-92] 92 (11/21 0258) Resp:  [21-22] 21 (11/21 0258) BP: (126-168)/(75-133) 126/75 (11/21 0258) SpO2:  [94 %-95 %] 95 % (11/21 0258) Constitutional:  Alert and oriented, No acute distress Cardiovascular: Regular rate and rhythm, No JVD Respiratory: Normal respiratory effort, Lungs clear bilaterally GI: Abdomen is soft, nontender, nondistended, no abdominal masses GU: 24 [place] three-way hematuria  catheter in place and draining grossly bloody urine with clots   Laboratory Data:  Recent Labs    03/24/22 0129  WBC 13.1*  HGB 14.3  HCT 45.0  PLT 290    Recent Labs    03/24/22 0129  NA 139  K 3.9  CL 106  GLUCOSE 144*  BUN 39*  CALCIUM 8.5*  CREATININE 1.09     Results for orders placed or performed during the hospital encounter of 03/24/22 (from the past 24 hour(s))   CBC with Differential/Platelet     Status: Abnormal   Collection Time: 03/24/22  1:29 AM  Result Value Ref Range   WBC 13.1 (H) 4.0 - 10.5 K/uL   RBC 4.95 4.22 - 5.81 MIL/uL   Hemoglobin 14.3 13.0 - 17.0 g/dL   HCT 45.0 39.0 - 52.0 %   MCV 90.9 80.0 - 100.0 fL   MCH 28.9 26.0 - 34.0 pg   MCHC 31.8 30.0 - 36.0 g/dL   RDW 14.3 11.5 - 15.5 %   Platelets 290 150 - 400 K/uL   nRBC 0.0 0.0 - 0.2 %   Neutrophils Relative % 47 %   Neutro Abs 6.2 1.7 - 7.7 K/uL   Lymphocytes Relative 40 %   Lymphs Abs 5.2 (H) 0.7 - 4.0 K/uL   Monocytes Relative 9 %   Monocytes Absolute 1.2 (H) 0.1 - 1.0 K/uL   Eosinophils Relative 2 %   Eosinophils Absolute 0.3 0.0 - 0.5 K/uL   Basophils Relative 1 %   Basophils Absolute 0.1 0.0 - 0.1 K/uL   Immature Granulocytes 1 %   Abs Immature Granulocytes 0.12 (H) 0.00 - 0.07 K/uL  Basic metabolic panel     Status: Abnormal   Collection Time: 03/24/22  1:29 AM  Result Value Ref Range   Sodium 139 135 - 145 mmol/L   Potassium 3.9 3.5 - 5.1 mmol/L   Chloride 106 98 - 111 mmol/L   CO2 21 (L) 22 - 32 mmol/L   Glucose, Bld 144 (H) 70 - 99 mg/dL   BUN 39 (H) 8 - 23 mg/dL   Creatinine, Ser 1.09 0.61 - 1.24 mg/dL   Calcium 8.5 (L) 8.9 - 10.3 mg/dL   GFR, Estimated >60 >60 mL/min   Anion gap 12 5 - 15   No results found for this or any previous visit (from the past 240 hour(s)).  Renal Function: Recent Labs    03/19/22 2102 03/24/22 0129  CREATININE 1.18 1.09   Estimated Creatinine Clearance: 74.1 mL/min (by C-G formula based on SCr of 1.09 mg/dL).  Radiologic Imaging: No results found.  Assessment:  Clot urinary retention following TURP  Plan:  The risk, benefits and alternatives of cystoscopy with clot evacuation and fulguration was discussed with the patient.  He voices understanding and wishes to proceed.  Ellison Hughs, MD 03/24/2022, 3:03 AM  [facility] Pager: [phone]

## 2022-03-24 NOTE — Progress Notes (Signed)
Mobility Specialist - Progress Note   03/24/22 1041  Mobility  Activity Ambulated with assistance in hallway  Level of Assistance Modified independent, requires aide device or extra time  Assistive Device Other (Comment) (iv pole)  Distance Ambulated (ft) 750 ft  Activity Response Tolerated well  Mobility Referral Yes  $Mobility charge 1 Mobility   Pt received in bed and agreed to mobility, no c/o pain nor discomfort during session. Pt returned to bed with all needs met and family in room.  Roderick Pee Mobility Specialist

## 2022-03-24 NOTE — Anesthesia Preprocedure Evaluation (Addendum)
Anesthesia Evaluation  Patient identified by MRN, date of birth, ID band Patient awake    Reviewed: Allergy & Precautions, NPO status , Patient's Chart, lab work & pertinent test results  History of Anesthesia Complications Negative for: history of anesthetic complications  Airway Mallampati: II  TM Distance: >3 FB Neck ROM: Full    Dental  (+) Dental Advisory Given, Teeth Intact   Pulmonary neg pulmonary ROS   Pulmonary exam normal        Cardiovascular negative cardio ROS Normal cardiovascular exam     Neuro/Psych negative neurological ROS  negative psych ROS   GI/Hepatic negative GI ROS, Neg liver ROS,,,  Endo/Other  negative endocrine ROS    Renal/GU negative Renal ROS     Musculoskeletal  (+) Arthritis , Osteoarthritis,    Abdominal   Peds  Hematology  Polycythemia    Anesthesia Other Findings   Reproductive/Obstetrics                             Anesthesia Physical Anesthesia Plan  ASA: 2 and emergent  Anesthesia Plan: General   Post-op Pain Management: Minimal or no pain anticipated   Induction: Intravenous and Rapid sequence  PONV Risk Score and Plan: 2 and Treatment may vary due to age or medical condition, Ondansetron and Dexamethasone  Airway Management Planned: Oral ETT  Additional Equipment: None  Intra-op Plan:   Post-operative Plan: Extubation in OR  Informed Consent: I have reviewed the patients History and Physical, chart, labs and discussed the procedure including the risks, benefits and alternatives for the proposed anesthesia with the patient or authorized representative who has indicated his/her understanding and acceptance.     Dental advisory given  Plan Discussed with: CRNA and Anesthesiologist  Anesthesia Plan Comments:        Anesthesia Quick Evaluation

## 2022-03-25 ENCOUNTER — Encounter (HOSPITAL_COMMUNITY): Payer: Self-pay | Admitting: Urology

## 2022-03-25 ENCOUNTER — Other Ambulatory Visit: Payer: Self-pay | Admitting: Urology

## 2022-03-25 MED ORDER — SULFAMETHOXAZOLE-TRIMETHOPRIM 800-160 MG PO TABS: 1.0000 | ORAL_TABLET | Freq: Two times a day (BID) | ORAL | 0 refills | Status: DC

## 2022-03-25 NOTE — Anesthesia Postprocedure Evaluation (Signed)
Anesthesia Post Note  Patient: Brad Davis  Procedure(s) Performed: CYSTOSCOPY, CLOT EVACUATION WITH FULGERATION (Urethra)     Patient location during evaluation: PACU Anesthesia Type: General Level of consciousness: awake and alert Pain management: pain level controlled Vital Signs Assessment: post-procedure vital signs reviewed and stable Respiratory status: spontaneous breathing, nonlabored ventilation, respiratory function stable and patient connected to nasal cannula oxygen Cardiovascular status: blood pressure returned to baseline and stable Postop Assessment: no apparent nausea or vomiting Anesthetic complications: no   No notable events documented.  Last Vitals:  Vitals:   03/24/22 0547 03/24/22 0940  BP: 126/73 127/78  Pulse: 91 86  Resp: 18 18  Temp: 36.5 C 36.6 C  SpO2:  99%    Last Pain:  Vitals:   03/24/22 0940  TempSrc: Oral  PainSc:                  Audry Pili

## 2022-04-08 ENCOUNTER — Other Ambulatory Visit: Payer: Self-pay

## 2022-04-08 ENCOUNTER — Emergency Department (HOSPITAL_COMMUNITY): Payer: Medicare Other

## 2022-04-08 ENCOUNTER — Encounter (HOSPITAL_COMMUNITY): Payer: Self-pay | Admitting: *Deleted

## 2022-04-08 ENCOUNTER — Encounter (HOSPITAL_COMMUNITY)
Admission: EM | Disposition: A | Discharge status: Home / Self Care | Payer: Self-pay | Source: Home / Self Care | Attending: Emergency Medicine

## 2022-04-08 ENCOUNTER — Emergency Department (HOSPITAL_BASED_OUTPATIENT_CLINIC_OR_DEPARTMENT_OTHER): Payer: Medicare Other | Admitting: Anesthesiology

## 2022-04-08 ENCOUNTER — Emergency Department (HOSPITAL_COMMUNITY): Payer: Medicare Other | Admitting: Anesthesiology

## 2022-04-08 ENCOUNTER — Ambulatory Visit (HOSPITAL_COMMUNITY)
Admission: EM | Admit: 2022-04-08 | Discharge: 2022-04-08 | Disposition: A | Discharge status: Home / Self Care | Payer: Medicare Other | Attending: Emergency Medicine | Admitting: Emergency Medicine

## 2022-04-08 DIAGNOSIS — R339 Retention of urine, unspecified: Secondary | ICD-10-CM | POA: Diagnosis not present

## 2022-04-08 DIAGNOSIS — N3289 Other specified disorders of bladder: Secondary | ICD-10-CM | POA: Diagnosis not present

## 2022-04-08 DIAGNOSIS — N401 Enlarged prostate with lower urinary tract symptoms: Secondary | ICD-10-CM | POA: Insufficient documentation

## 2022-04-08 DIAGNOSIS — R310 Gross hematuria: Secondary | ICD-10-CM | POA: Insufficient documentation

## 2022-04-08 DIAGNOSIS — M1990 Unspecified osteoarthritis, unspecified site: Secondary | ICD-10-CM | POA: Insufficient documentation

## 2022-04-08 HISTORY — PX: CYSTOSCOPY WITH FULGERATION: SHX6638

## 2022-04-08 LAB — BASIC METABOLIC PANEL
Anion gap: 8 (ref 5–15)
BUN: 28 mg/dL — ABNORMAL HIGH (ref 8–23)
CO2: 23 mmol/L (ref 22–32)
Calcium: 8.5 mg/dL — ABNORMAL LOW (ref 8.9–10.3)
Chloride: 107 mmol/L (ref 98–111)
Creatinine, Ser: 0.79 mg/dL (ref 0.61–1.24)
GFR, Estimated: >60 mL/min (ref >60)
Glucose, Bld: 105 mg/dL — ABNORMAL HIGH (ref 70–99)
Potassium: 3.7 mmol/L (ref 3.5–5.1)
Sodium: 138 mmol/L (ref 135–145)

## 2022-04-08 LAB — CBC WITH DIFFERENTIAL/PLATELET
Abs Immature Granulocytes: 0.06 10*3/uL (ref 0.00–0.07)
Basophils Absolute: 0.1 10*3/uL (ref 0.0–0.1)
Basophils Relative: 1 %
Eosinophils Absolute: 0.1 10*3/uL (ref 0.0–0.5)
Eosinophils Relative: 1 %
HCT: 38.9 % — ABNORMAL LOW (ref 39.0–52.0)
Hemoglobin: 12.3 g/dL — ABNORMAL LOW (ref 13.0–17.0)
Immature Granulocytes: 1 %
Lymphocytes Relative: 16 %
Lymphs Abs: 1.7 10*3/uL (ref 0.7–4.0)
MCH: 27.3 pg (ref 26.0–34.0)
MCHC: 31.6 g/dL (ref 30.0–36.0)
MCV: 86.4 fL (ref 80.0–100.0)
Monocytes Absolute: 0.7 10*3/uL (ref 0.1–1.0)
Monocytes Relative: 7 %
Neutro Abs: 7.9 10*3/uL — ABNORMAL HIGH (ref 1.7–7.7)
Neutrophils Relative %: 74 %
Platelets: 206 10*3/uL (ref 150–400)
RBC: 4.5 MIL/uL (ref 4.22–5.81)
RDW: 13.4 % (ref 11.5–15.5)
WBC: 10.5 10*3/uL (ref 4.0–10.5)
nRBC: 0 % (ref 0.0–0.2)

## 2022-04-08 LAB — URINALYSIS, ROUTINE W REFLEX MICROSCOPIC
RBC / HPF: >50 RBC/hpf — ABNORMAL HIGH (ref 0–5)
WBC, UA: >50 WBC/hpf — ABNORMAL HIGH (ref 0–5)

## 2022-04-08 SURGERY — CYSTOSCOPY, WITH BLADDER FULGURATION
Anesthesia: General | Site: Bladder

## 2022-04-08 MED ORDER — CHLORHEXIDINE GLUCONATE 0.12 % MT SOLN
15.0000 mL | Freq: Once | OROMUCOSAL | Status: AC
  Filled 2022-04-08: qty 15

## 2022-04-08 MED ORDER — PHENYLEPHRINE 80 MCG/ML (10ML) SYRINGE FOR IV PUSH (FOR BLOOD PRESSURE SUPPORT)
PREFILLED_SYRINGE | INTRAVENOUS | Status: AC
  Filled 2022-04-08: qty 20

## 2022-04-08 MED ORDER — PHENYLEPHRINE 80 MCG/ML (10ML) SYRINGE FOR IV PUSH (FOR BLOOD PRESSURE SUPPORT)
PREFILLED_SYRINGE | INTRAVENOUS | Status: DC | PRN
  Administered 2022-04-08 (×2): 240 ug via INTRAVENOUS
  Administered 2022-04-08: 120 ug via INTRAVENOUS

## 2022-04-08 MED ORDER — IOHEXOL 300 MG/ML  SOLN
INTRAMUSCULAR | Status: DC | PRN
  Administered 2022-04-08: 30 mL

## 2022-04-08 MED ORDER — SODIUM CHLORIDE 0.9 % IR SOLN
Status: DC | PRN
  Administered 2022-04-08: 9000 mL

## 2022-04-08 MED ORDER — LIDOCAINE 2% (20 MG/ML) 5 ML SYRINGE
INTRAMUSCULAR | Status: DC | PRN
  Administered 2022-04-08: 100 mg via INTRAVENOUS

## 2022-04-08 MED ORDER — ONDANSETRON HCL 4 MG/2ML IJ SOLN: 4.0000 mg | Freq: Once | INTRAMUSCULAR | Status: DC | PRN

## 2022-04-08 MED ORDER — ONDANSETRON HCL 4 MG/2ML IJ SOLN
INTRAMUSCULAR | Status: DC | PRN
  Administered 2022-04-08: 4 mg via INTRAVENOUS

## 2022-04-08 MED ORDER — CIPROFLOXACIN IN D5W 400 MG/200ML IV SOLN
INTRAVENOUS | Status: AC
  Filled 2022-04-08: qty 200

## 2022-04-08 MED ORDER — LACTATED RINGERS IV SOLN: INTRAVENOUS | Status: DC

## 2022-04-08 MED ORDER — ONDANSETRON HCL 4 MG/2ML IJ SOLN
INTRAMUSCULAR | Status: AC
  Filled 2022-04-08: qty 2

## 2022-04-08 MED ORDER — SODIUM CHLORIDE 0.9 % IR SOLN
3000.0000 mL | Status: DC
  Administered 2022-04-08: 3000 mL

## 2022-04-08 MED ORDER — PHENYLEPHRINE HCL (PRESSORS) 10 MG/ML IV SOLN
INTRAVENOUS | Status: AC
  Filled 2022-04-08: qty 1

## 2022-04-08 MED ORDER — ORAL CARE MOUTH RINSE
15.0000 mL | Freq: Once | OROMUCOSAL | Status: AC
  Administered 2022-04-08: 15 mL via OROMUCOSAL

## 2022-04-08 MED ORDER — DEXAMETHASONE SODIUM PHOSPHATE 10 MG/ML IJ SOLN
INTRAMUSCULAR | Status: AC
  Filled 2022-04-08: qty 1

## 2022-04-08 MED ORDER — PROPOFOL 10 MG/ML IV BOLUS
INTRAVENOUS | Status: DC | PRN
  Administered 2022-04-08: 200 mg via INTRAVENOUS

## 2022-04-08 MED ORDER — DEXAMETHASONE SODIUM PHOSPHATE 10 MG/ML IJ SOLN
INTRAMUSCULAR | Status: DC | PRN
  Administered 2022-04-08: 10 mg via INTRAVENOUS

## 2022-04-08 MED ORDER — FENTANYL CITRATE (PF) 100 MCG/2ML IJ SOLN
INTRAMUSCULAR | Status: DC | PRN
  Administered 2022-04-08: 100 ug via INTRAVENOUS

## 2022-04-08 MED ORDER — LIDOCAINE HCL (PF) 2 % IJ SOLN
INTRAMUSCULAR | Status: AC
  Filled 2022-04-08: qty 5

## 2022-04-08 MED ORDER — MIDAZOLAM HCL 2 MG/2ML IJ SOLN
INTRAMUSCULAR | Status: AC
  Filled 2022-04-08: qty 2

## 2022-04-08 MED ORDER — PHENYLEPHRINE HCL-NACL 20-0.9 MG/250ML-% IV SOLN
INTRAVENOUS | Status: DC | PRN
  Administered 2022-04-08: 50 ug/min via INTRAVENOUS

## 2022-04-08 MED ORDER — ACETAMINOPHEN 10 MG/ML IV SOLN
1000.0000 mg | Freq: Once | INTRAVENOUS | Status: DC | PRN
  Administered 2022-04-08: 1000 mg via INTRAVENOUS

## 2022-04-08 MED ORDER — AMISULPRIDE (ANTIEMETIC) 5 MG/2ML IV SOLN: 10.0000 mg | Freq: Once | INTRAVENOUS | Status: DC | PRN

## 2022-04-08 MED ORDER — SULFAMETHOXAZOLE-TRIMETHOPRIM 800-160 MG PO TABS: 1.0000 | ORAL_TABLET | Freq: Two times a day (BID) | ORAL | 0 refills | Status: DC

## 2022-04-08 MED ORDER — OXYCODONE HCL 5 MG/5ML PO SOLN: 5.0000 mg | Freq: Once | ORAL | Status: DC | PRN

## 2022-04-08 MED ORDER — 0.9 % SODIUM CHLORIDE (POUR BTL) OPTIME
TOPICAL | Status: DC | PRN
  Administered 2022-04-08: 1000 mL

## 2022-04-08 MED ORDER — ACETAMINOPHEN 10 MG/ML IV SOLN
INTRAVENOUS | Status: AC
  Filled 2022-04-08: qty 100

## 2022-04-08 MED ORDER — FENTANYL CITRATE (PF) 100 MCG/2ML IJ SOLN
INTRAMUSCULAR | Status: AC
  Filled 2022-04-08: qty 2

## 2022-04-08 MED ORDER — OXYCODONE HCL 5 MG PO TABS: 5.0000 mg | ORAL_TABLET | Freq: Once | ORAL | Status: DC | PRN

## 2022-04-08 MED ORDER — CIPROFLOXACIN IN D5W 400 MG/200ML IV SOLN
INTRAVENOUS | Status: DC | PRN
  Administered 2022-04-08: 400 mg via INTRAVENOUS

## 2022-04-08 MED ORDER — HYDROMORPHONE HCL 1 MG/ML IJ SOLN: 0.2500 mg | INTRAMUSCULAR | Status: DC | PRN

## 2022-04-08 MED ORDER — MIDAZOLAM HCL 2 MG/2ML IJ SOLN
INTRAMUSCULAR | Status: DC | PRN
  Administered 2022-04-08: 2 mg via INTRAVENOUS

## 2022-04-08 SURGICAL SUPPLY — 21 items
BAG URO CATCHER STRL LF (MISCELLANEOUS) ×2 IMPLANT
CATH FOLEY 2WAY SLVR  5CC 20FR (CATHETERS) ×1
CATH FOLEY 2WAY SLVR 5CC 20FR (CATHETERS) IMPLANT
CATH URETHERAL OPEN END 7FR (CATHETERS) IMPLANT
DRAPE FOOT SWITCH (DRAPES) ×2 IMPLANT
ELECT REM PT RETURN 15FT ADLT (MISCELLANEOUS) ×2 IMPLANT
GLOVE SURG LX STRL 7.5 STRW (GLOVE) ×2 IMPLANT
GOWN STRL REUS W/ TWL LRG LVL3 (GOWN DISPOSABLE) ×4 IMPLANT
GOWN STRL REUS W/ TWL XL LVL3 (GOWN DISPOSABLE) ×2 IMPLANT
GOWN STRL REUS W/TWL LRG LVL3 (GOWN DISPOSABLE) ×2
GOWN STRL REUS W/TWL XL LVL3 (GOWN DISPOSABLE) ×1
KIT TURNOVER KIT A (KITS) IMPLANT
LOOP CUT BIPOLAR 24F LRG (ELECTROSURGICAL) IMPLANT
MANIFOLD NEPTUNE II (INSTRUMENTS) ×2 IMPLANT
NDL SAFETY ECLIP 18X1.5 (MISCELLANEOUS) ×2 IMPLANT
PACK CYSTO (CUSTOM PROCEDURE TRAY) ×2 IMPLANT
SYR 10ML LL (SYRINGE) IMPLANT
SYR TOOMEY IRRIG 70ML (MISCELLANEOUS)
SYRINGE TOOMEY IRRIG 70ML (MISCELLANEOUS) IMPLANT
TUBING CONNECTING 10 (TUBING) ×2 IMPLANT
TUBING UROLOGY SET (TUBING) ×2 IMPLANT

## 2022-04-08 NOTE — Anesthesia Procedure Notes (Signed)
Procedure Name: LMA Insertion Date/Time: 04/08/2022 11:09 AM  Performed by: [place] Manus Rudd, CRNAPre-anesthesia Checklist: Patient identified, Emergency Drugs available, Suction available and Patient being monitored Patient Re-evaluated:Patient Re-evaluated prior to induction Oxygen Delivery Method: Circle system utilized Preoxygenation: Pre-oxygenation with 100% oxygen Induction Type: IV induction Ventilation: Mask ventilation without difficulty LMA: LMA with gastric port inserted LMA Size: 4.0 Number of attempts: 1 Airway Equipment and Method: Bite block Placement Confirmation: positive ETCO2 Tube secured with: Tape Dental Injury: Teeth and Oropharynx as per pre-operative assessment

## 2022-04-08 NOTE — Anesthesia Postprocedure Evaluation (Signed)
Anesthesia Post Note  Patient: Brad Davis  Procedure(s) Performed: CYSTOSCOPY, BILATERAL RETROGRADE PYELOGRAM, CLOT EVACUATION, FULGERATION/ RE-RESECTION (Bladder)     Patient location during evaluation: PACU Anesthesia Type: General Level of consciousness: awake and alert Pain management: pain level controlled Vital Signs Assessment: post-procedure vital signs reviewed and stable Respiratory status: spontaneous breathing, nonlabored ventilation, respiratory function stable and patient connected to nasal cannula oxygen Cardiovascular status: blood pressure returned to baseline and stable Postop Assessment: no apparent nausea or vomiting Anesthetic complications: no  No notable events documented.  Last Vitals:  Vitals:   04/08/22 1245 04/08/22 1301  BP: 125/83 129/86  Pulse: 78 78  Resp: 17 18  Temp:    SpO2: 98% 99%    Last Pain:  Vitals:   04/08/22 1301  TempSrc:   PainSc: 2                  Barnet Glasgow

## 2022-04-08 NOTE — ED Triage Notes (Signed)
Pt states that he had his foley put back in yesterday, his urine was clear, then started to notice blood in the catheter. Now it is not draining as much and the bloody urine is coming out around the catheter.

## 2022-04-08 NOTE — Transfer of Care (Signed)
Immediate Anesthesia Transfer of Care Note  Patient: Brad Davis  Procedure(s) Performed: CYSTOSCOPY, BILATERAL RETROGRADE PYELOGRAM, CLOT EVACUATION, FULGERATION/ RE-RESECTION (Bladder)  Patient Location: PACU  Anesthesia Type:General  Level of Consciousness: awake and drowsy  Airway & Oxygen Therapy: Patient Spontanous Breathing and Patient connected to face mask oxygen  Post-op Assessment: Report given to RN and Post -op Vital signs reviewed and stable  Post vital signs: Reviewed and stable  Last Vitals:  Vitals Value Taken Time  BP 116/67 04/08/22 1211  Temp    Pulse 74 04/08/22 1212  Resp 11 04/08/22 1212  SpO2 100 % 04/08/22 1212  Vitals shown include unvalidated device data.  Last Pain:  Vitals:   04/08/22 0934  TempSrc:   PainSc: 8       Patients Stated Pain Goal: 0 (46/43/14 2767)  Complications: No notable events documented.

## 2022-04-08 NOTE — ED Provider Notes (Signed)
[facility] DEPT Provider Note   CSN: [id number] Arrival date & time: 04/08/22  0351     History  No chief complaint on file.   Brad Davis is a 66 y.o. male.  Patient presents to the emergency department for evaluation of urinary retention.  Patient has had ongoing issues with urinary retention secondary to hematuria and bladder clots.  Patient started to see blood in his urine again yesterday.  He was seen in the urology clinic, had a Foley catheter placed and bladder was irrigated.  He reports he was doing well when he left but starting tonight he is seeing gross blood again and now catheter is blocked and he is experiencing urine overflow around the catheter.       Home Medications Prior to Admission medications   Medication Sig Start Date End Date Taking? Authorizing Provider  Ascorbic Acid (VITAMIN C WITH ROSE HIPS) 1000 MG tablet Take 1,000 mg by mouth 2 (two) times daily.    [provider]  meloxicam (MOBIC) 15 MG tablet Take 15 mg by mouth daily.    [provider]  Multiple Vitamins-Minerals (MULTIVITAMIN WITH MINERALS) tablet Take 2 tablets by mouth daily.    [provider]  NANDROLONE DECANOATE IJ Inject 210 mg as directed once a week.    [provider]  OVER THE COUNTER MEDICATION Take 5 mg by mouth daily. Creatinine 5 mg daily    [provider]  OVER THE COUNTER MEDICATION Take 2 Scoops by mouth at bedtime. Whey protein powder 2 scoops    [provider]  phenazopyridine (PYRIDIUM) 200 MG tablet Take 1 tablet (200 mg total) by mouth 3 (three) times daily as needed for pain. Patient not taking: Reported on 03/20/2022 02/13/22   Ardis Hughs, MD  PRESCRIPTION MEDICATION Inject 5 Units into the skin daily. Medication: Human Growth hormone    [provider]  PRESCRIPTION MEDICATION Take 25 mg by mouth daily. Medication: [place] Patient not taking:  Reported on 03/24/2022    [provider]  sulfamethoxazole-trimethoprim (BACTRIM DS) 800-160 MG tablet Take 1 tablet by mouth 2 (two) times daily. 03/25/22   Irine Seal, MD  Tadalafil (CIALIS PO) Take 30 mg by mouth daily. Liquid compound, unknown dosage form    [provider]  Tetrahydrozoline HCl (VISINE OP) Place 1 drop into both eyes 2 (two) times daily as needed (redness).    [provider]  UNABLE TO FIND Inject 250 mg into the skin 2 (two) times a week. Testosterone 250 mg injection    [provider]      Allergies    Chlorhexidine    Review of Systems   Review of Systems  Physical Exam Updated Vital Signs BP 134/86   Pulse 80   Temp 97.9 F (36.6 C) (Oral)   Resp 16   SpO2 98%  Physical Exam Vitals and nursing note reviewed.  Constitutional:      General: He is not in acute distress.    Appearance: He is well-developed.  HENT:     Head: Normocephalic and atraumatic.     Mouth/Throat:     Mouth: Mucous membranes are moist.  Eyes:     General: Vision grossly intact. Gaze aligned appropriately.     Extraocular Movements: Extraocular movements intact.     Conjunctiva/sclera: Conjunctivae normal.  Cardiovascular:     Rate and Rhythm: Normal rate and regular rhythm.     Pulses: Normal pulses.  Heart sounds: Normal heart sounds, S1 normal and S2 normal. No murmur heard.    No friction rub. No gallop.  Pulmonary:     Effort: Pulmonary effort is normal. No respiratory distress.     Breath sounds: Normal breath sounds.  Abdominal:     Palpations: Abdomen is soft.     Tenderness: There is no abdominal tenderness. There is no guarding or rebound.     Hernia: No hernia is present.  Musculoskeletal:        General: No swelling.     Cervical back: Full passive range of motion without pain, normal range of motion and neck supple. No pain with movement, spinous process tenderness or muscular tenderness. Normal range of motion.      Right lower leg: No edema.     Left lower leg: No edema.  Skin:    General: Skin is warm and dry.     Capillary Refill: Capillary refill takes less than 2 seconds.     Findings: No ecchymosis, erythema, lesion or wound.  Neurological:     Mental Status: He is alert and oriented to person, place, and time.     GCS: GCS eye subscore is 4. GCS verbal subscore is 5. GCS motor subscore is 6.     Cranial Nerves: Cranial nerves 2-12 are intact.     Sensory: Sensation is intact.     Motor: Motor function is intact. No weakness or abnormal muscle tone.     Coordination: Coordination is intact.  Psychiatric:        Mood and Affect: Mood normal.        Speech: Speech normal.        Behavior: Behavior normal.     ED Results / Procedures / Treatments   Labs (all labs ordered are listed, but only abnormal results are displayed) Labs Reviewed  CBC WITH DIFFERENTIAL/PLATELET - Abnormal; Notable for the following components:      Result Value   Hemoglobin 12.3 (*)    HCT 38.9 (*)    Neutro Abs 7.9 (*)    All other components within normal limits  BASIC METABOLIC PANEL - Abnormal; Notable for the following components:   Glucose, Bld 105 (*)    BUN 28 (*)    Calcium 8.5 (*)    All other components within normal limits  URINALYSIS, ROUTINE W REFLEX MICROSCOPIC - Abnormal; Notable for the following components:   Color, Urine RED (*)    APPearance CLOUDY (*)    Glucose, UA   (*)    Value: TEST NOT REPORTED DUE TO COLOR INTERFERENCE OF URINE PIGMENT   Hgb urine dipstick   (*)    Value: TEST NOT REPORTED DUE TO COLOR INTERFERENCE OF URINE PIGMENT   Bilirubin Urine   (*)    Value: TEST NOT REPORTED DUE TO COLOR INTERFERENCE OF URINE PIGMENT   Ketones, ur   (*)    Value: TEST NOT REPORTED DUE TO COLOR INTERFERENCE OF URINE PIGMENT   Protein, ur   (*)    Value: TEST NOT REPORTED DUE TO COLOR INTERFERENCE OF URINE PIGMENT   Nitrite   (*)    Value: TEST NOT REPORTED DUE TO COLOR INTERFERENCE OF  URINE PIGMENT   Leukocytes,Ua   (*)    Value: TEST NOT REPORTED DUE TO COLOR INTERFERENCE OF URINE PIGMENT   RBC / HPF >50 (*)    WBC, UA >50 (*)    Bacteria, UA FEW (*)    All  other components within normal limits    EKG None  Radiology No results found.  Procedures Procedures    Medications Ordered in ED Medications  sodium chloride irrigation 0.9 % 3,000 mL (3,000 mLs Irrigation New Bag/Given 04/08/22 0446)    ED Course/ Medical Decision Making/ A&P                           Medical Decision Making Amount and/or Complexity of Data Reviewed Labs: ordered.  Risk Prescription drug management.   Presents with gross hematuria. Has had recurrent episodes since TURP 2 months ago. Had a catheter placed at urology office yesterday, it is now blocked.Replaced with 24FR threeway and CBI administered, mostly cleared now. Dr. Louis Meckel to see,        Final Clinical Impression(s) / ED Diagnoses Final diagnoses:  Gross hematuria    Rx / DC Orders ED Discharge Orders     None         Orpah Greek, MD 04/08/22 [phone]

## 2022-04-08 NOTE — Consult Note (Signed)
I have been asked to see the patient by Dr. Scot Jun, for evaluation and management of gross hematuria.  History of present illness: Brad Davis s/p TURP two months prior.  He has now been to the ED three times with gross hematuria and twice required clot evacuation.  He was seen in the clinic yesterday and a large three-way foley catheter his bladder irrigated clear.  Overnight he was doing well but started to have larger clots and bladder spasms with urine/clots gushing around his catheter.  In the ED he was hand irrigated and then started on CBI.  His urine has cleared.  His pain is improving.  Review of systems: A 12 point comprehensive review of systems was obtained and is negative unless otherwise stated in the history of present illness.  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   Current Outpatient Medications on File Prior to Encounter  Medication Sig Dispense Refill   Ascorbic Acid (VITAMIN C WITH ROSE HIPS) 1000 MG tablet Take 1,000 mg by mouth 2 (two) times daily.     meloxicam (MOBIC) 15 MG tablet Take 15 mg by mouth daily.     Multiple Vitamins-Minerals (MULTIVITAMIN WITH MINERALS) tablet Take 2 tablets by mouth daily.     NANDROLONE DECANOATE IJ Inject 210 mg as directed once a week.     OVER THE COUNTER MEDICATION Take 5 mg by mouth daily. Creatinine 5 mg daily     OVER THE COUNTER MEDICATION Take 2 Scoops by mouth at bedtime. Whey protein powder 2 scoops     phenazopyridine (PYRIDIUM) 200 MG tablet Take 1 tablet (200 mg total) by mouth 3 (three) times daily as needed for pain. (Patient not taking: Reported on 03/20/2022) 10 tablet 0   PRESCRIPTION MEDICATION Inject 5 Units into the skin daily. Medication: Human Growth hormone     PRESCRIPTION MEDICATION Take 25 mg by mouth daily. Medication:  Methandrostolone (Patient not taking: Reported on 03/24/2022)     sulfamethoxazole-trimethoprim (BACTRIM DS) 800-160 MG tablet Take 1 tablet by mouth 2 (two) times daily. 8 tablet 0   Tadalafil (CIALIS PO) Take 30 mg by mouth daily. Liquid compound, unknown dosage form     Tetrahydrozoline HCl (VISINE OP) Place 1 drop into both eyes 2 (two) times daily as needed (redness).     UNABLE TO FIND Inject 250 mg into the skin 2 (two) times a week. Testosterone 250 mg injection      Past Medical History:  Diagnosis Date   Carcinoma ([place])    right chest small area to be removed jan  2024   DJD (degenerative joint disease)    History of COVID-19 04/2020   mild symptoms all resolved   Polycythemia    giving blood 02-07-2021 at one blood for, gives blood q month for    Past Surgical History:  Procedure Laterality Date    right elbow bursa sack repair -triceps strengthening   12/2020   ABSCESS DRAINAGE  05/04/2006   buttock   APPENDECTOMY  05/04/2004   open   [place] N/A 03/24/2022   Procedure: CYSTOSCOPY, CLOT EVACUATION WITH FULGERATION;  Surgeon: Ceasar Mons, MD;  Location: [facility];  Service: Urology;  Laterality: N/A;   GREAT TOE ARTHRODESIS, INTERPHALANGEAL JOINT     rt 30 yrs ago   [place]  rt   left rotator cuff  08/2020   LUMBAR LAMINECTOMY  05/05/1995   MASS EXCISION Right 12/14/2013   Procedure: MINOR EXCISION OF MASS;  Surgeon: Ninetta Lights, MD;  Location: [facility];  Service: Orthopedics;  Laterality: Right;   right shoulder cyst removed  2016   SHOULDER ARTHROSCOPY  05/04/1986   left   SHOULDER ARTHROSCOPY WITH ROTATOR CUFF REPAIR AND SUBACROMIAL DECOMPRESSION Left 10/20/2012   Procedure: LEFT SHOULDER ARTHROSCOPY , DISTAL CLAVICULECTOMY, EXTENSIVE DEBRIDEMENT, WITH SUBACROMIAL DECOMPRESSION, DEBRIDE LABRUM ROTATOR CUFF;  Surgeon: Ninetta Lights, MD;  Location: [facility];  Service: Orthopedics;   Laterality: Left;   TRANSURETHRAL RESECTION OF PROSTATE N/A 02/12/2022   Procedure: TRANSURETHRAL RESECTION OF THE PROSTATE (TURP);  Surgeon: Ardis Hughs, MD;  Location: [facility];  Service: Urology;  Laterality: N/A;  100 MINUTES NEEDED FOR CASE   TRANSURETHRAL RESECTION OF PROSTATE N/A 03/20/2022   Procedure: cystoscopy clot evacuation and fulguration;  Surgeon: Vira Agar, MD;  Location: [facility];  Service: Urology;  Laterality: N/A;    Social History   Tobacco Use   Smoking status: Never   Smokeless tobacco: Never  Vaping Use   Vaping Use: Never used  Substance Use Topics   Alcohol use: No   Drug use: No    No family history on file.  PE: Vitals:   04/08/22 0408 04/08/22 0412 04/08/22 0606  BP:  (!) 158/86 134/86  Pulse: 91 88 80  Resp: 16  16  Temp:  97.9 F (36.6 C)   TempSrc:  Oral   SpO2: 100% 98% 98%   Patient appears to be in no acute distress  patient is alert and oriented x3 Atraumatic normocephalic head No cervical or supraclavicular lymphadenopathy appreciated No increased work of breathing, no audible wheezes/rhonchi Regular sinus rhythm/rate Abdomen is soft, nontender, nondistended, no CVA or suprapubic tenderness Catheter draining blood tinged urine. Lower extremities are symmetric without appreciable edema Grossly neurologically intact No identifiable skin lesions  Recent Labs    04/08/22 0430  WBC 10.5  HGB 12.3*  HCT 38.9*   Recent Labs    04/08/22 0430  NA 138  K 3.7  CL 107  CO2 23  GLUCOSE 105*  BUN 28*  CREATININE 0.79  CALCIUM 8.5*   No results for input(s): "LABPT", "INR" in the last 72 hours. No results for input(s): "LABURIN" in the last 72 hours. Results for orders placed or performed during the hospital encounter of 12/14/13  Wound culture     Status: None   Collection Time: 12/14/13  1:30 PM  Result Value Ref Range Status   Specimen Description WOUND RIGHT SHOULDER  Final   Special  Requests NONE  Final   Gram Stain   Final    NO WBC SEEN NO SQUAMOUS EPITHELIAL CELLS SEEN NO ORGANISMS SEEN Performed at [facility]   Culture   Final    NO GROWTH 2 DAYS Performed at [facility]   Report Status 12/16/2013 FINAL  Final  Anaerobic culture     Status: None   Collection Time: 12/14/13  1:30 PM  Result Value Ref Range Status   Specimen Description WOUND RIGHT SHOULDER  Final   Special Requests NONE  Final   Gram Stain   Final    NO WBC SEEN NO SQUAMOUS EPITHELIAL CELLS SEEN NO ORGANISMS SEEN Performed at [facility]   Culture   Final    NO ANAEROBES ISOLATED  Note: NO PROPIONIBACTERIUM SPECIES ISOLATED Performed at [facility]   Report Status 12/28/2013 FINAL  Final    Imaging: none  Imp:  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  Unable to manage conservatively.  Recommendations: Given three trips to the ED I have recommended that we proceed to the OR for fulgaration/reTURP to try and clean up the varices.  I have reviewed the plan with the patient and discussed the rationale.  We discussed the risk/benefits.  He likely will be discharged home thereafter.  Ardis Hughs

## 2022-04-08 NOTE — Anesthesia Preprocedure Evaluation (Addendum)
Anesthesia Evaluation  Patient identified by MRN, date of birth, ID band Patient awake    Reviewed: Allergy & Precautions, NPO status , Patient's Chart, lab work & pertinent test results  History of Anesthesia Complications Negative for: history of anesthetic complications  Airway Mallampati: II  TM Distance: >3 FB Neck ROM: Full    Dental  (+) Dental Advisory Given, Teeth Intact   Pulmonary neg pulmonary ROS   Pulmonary exam normal        Cardiovascular negative cardio ROS Normal cardiovascular exam     Neuro/Psych negative neurological ROS  negative psych ROS   GI/Hepatic negative GI ROS, Neg liver ROS,,,  Endo/Other  negative endocrine ROS    Renal/GU negative Renal ROSLab Results      Component                Value               Date                      CREATININE               0.79                04/08/2022                BUN                      28 (H)              04/08/2022                NA                       138                 04/08/2022                K                        3.7                 04/08/2022                CL                       107                 04/08/2022                CO2                      23                  04/08/2022              BPH    Musculoskeletal  (+) Arthritis , Osteoarthritis,    Abdominal   Peds  Hematology Lab Results      Component                Value               Date                      WBC  10.5                04/08/2022                HGB                      12.3 (L)            04/08/2022                HCT                      38.9 (L)            04/08/2022                MCV                      86.4                04/08/2022                PLT                      206                 04/08/2022              Anesthesia Other Findings   Reproductive/Obstetrics                             Anesthesia  Physical Anesthesia Plan  ASA: 2 and emergent  Anesthesia Plan: General   Post-op Pain Management: Minimal or no pain anticipated   Induction: Intravenous and Rapid sequence  PONV Risk Score and Plan: 2 and Treatment may vary due to age or medical condition, Ondansetron and Dexamethasone  Airway Management Planned: LMA  Additional Equipment: None  Intra-op Plan:   Post-operative Plan: Extubation in OR  Informed Consent: I have reviewed the patients History and Physical, chart, labs and discussed the procedure including the risks, benefits and alternatives for the proposed anesthesia with the patient or authorized representative who has indicated his/her understanding and acceptance.     Dental advisory given  Plan Discussed with: CRNA and Anesthesiologist  Anesthesia Plan Comments:        Anesthesia Quick Evaluation

## 2022-04-08 NOTE — Progress Notes (Addendum)
Patient arrived to Short Stay with foley catheter and bladder irrigation. Normal Saline for irrigation was empty upon arrival. Mr. Brad Davis was very uncomfortable stating he had a lot of pressure. Dr Louis Meckel notified. Instructed to remove catheter and allow patient to urinate on his own. Patient urinated 425 ml. Stated he felt "much better" after urinating.

## 2022-04-08 NOTE — Op Note (Signed)
Preoperative diagnosis:  Recurrent hematuria Clot retention  Postoperative diagnosis:  Same  Procedure: Cystoscopy, blood clot evacuation Bilateral retrograde pyelogram with interpretation Repeat TURP-minimal tissue resected Copious prostatic fossa fulguration  Surgeon: Ardis Hughs, MD  Anesthesia: General  Complications: None  Intraoperative findings:  #1: There was a small amount of blood clot remaining in the patient's bladder which I was able to easily evacuate #2: There not appear to be a significant or large bleeding vessel within the prostatic bed. #3: There was some residual tissue at the the posterior prostatic urethra and down near the inferior to which I resected and copiously fulgurated.  I also fulgurated all the way around pain close attention anteriorly as well.  There may have been some friable mucosa at the bladder neck that was also fulgurated. #4: The patient's right retrograde pyelogram was performed with 15 cc of Omnipaque contrast and demonstrated a fairly tortuous ureter with some mild hydronephrosis and J hooking of the distal ureteral and intramural ureter.  There were no filling defects #5: The patient's left retrograde pyelogram was performed in a similar fashion as the right with the findings very similar.  There is no filling defects or abnormalities besides some tortuosity of the ureter, J hooking at the intramural ureter, and some mild hydronephrosis.  EBL: Minimal  Specimens: None  Indication: Brad Davis is a 66 y.o. patient with gross hematuria which has been refractory to conservative therapy.  He presented to the emergency department overnight with a occluded Foley catheter from ongoing persistent bleeding after we had seen him in our clinic earlier that day and placed a large 24 French three-way Foley catheter and copiously irrigated his bladder after reviewing the management options for treatment, he elected to proceed with the above  surgical procedure(s). We have discussed the potential benefits and risks of the procedure, side effects of the proposed treatment, the likelihood of the patient achieving the goals of the procedure, and any potential problems that might occur during the procedure or recuperation. Informed consent has been obtained.  Description of procedure:  The patient was taken to the operating room and general anesthesia was induced.  The patient was placed in the dorsal lithotomy position, prepped and draped in the usual sterile fashion, and preoperative antibiotics were administered. A preoperative time-out was performed.   I used a 21 [place] 30 degree cystoscope to gently navigate the patient's urethra and enter into the patient's bladder.  There was some small clot throughout the base of the bladder which I was able to easily evacuate.  I then performed retrograde pyelograms with the above findings.  I then removed the 21 French sheath exchanged for a 26 French resectoscope sheath.  I then scraped the necrotic tissue overlying the resected prostatic mucosa.  Underneath there were some mild areas of oozing which I was able to easily fulgurate.  I subsequently fulgurated the anterior prostatic fossa as well as the lateral areas.  There was some residual tissue at the base that I resected and then copiously fulgurated.  I also fulgurated very heavily at the apex near the [place].  All necrotic and newly resected tissue was removed, these were not sent for analysis.  I triple checked and there was no ongoing bleeding even with the bladder empty.  I subsequently remove the scope and advanced a 20 [place] two-way Foley catheter.  The patient was subsequently extubated and returned the PACU in stable condition.  Disposition: The patient being discharged home, he is  scheduled for Foley catheter removal tomorrow  Ardis Hughs, M.D.

## 2022-04-08 NOTE — Discharge Instructions (Signed)
Foley Catheter Care, Adult A soft, flexible tube (Foley catheter) has been placed in your bladder. This may be done to temporarily help with urine drainage after an operation or to relieve blockage from an enlarged prostate gland. HOME CARE INSTRUCTIONS  If you are going home with a Foley catheter in place, follow these instructions: Taking Care of the Catheter: Keep the area where the catheter leaves your body clean. Attach the catheter to the leg so there is no tension on the catheter. Keep the drainage bag below the level of the bladder, but keep it OFF the floor. Do not take long soaking baths.  Wash your hands before touching ANYTHING related to the catheter or bag. Using mild soap and warm water on a washcloth: Clean the area closest to the catheter insertion site using a circular motion around the catheter. Clean the catheter itself by wiping AWAY from the insertion site for several inches down the tube. NEVER wipe upward as this could sweep bacteria up into the urethra (tube in your body that normally drains the bladder) and cause infection. Taking Care of the Drainage Bags: Two drainage bags will be taken home: a large overnight drainage bag, and a smaller leg bag which fits underneath clothing. It is okay to wear the overnight bag at any time, but NEVER wear the smaller leg bag at night. Keep the drainage bag well below the level of your bladder. This prevents backflow of urine into the bladder and allows the urine to drain freely. Anchor the tubing to your leg to prevent pulling or tension on the catheter. Use tape or a leg strap provided by the hospital. Empty the drainage bag when it is  to  full. Wash your hands before and after touching the bag. Periodically check the tubing for kinks to make sure there is no pressure on the tubing which could restrict the flow of urine. Changing the Drainage Bags: Cleanse both ends of the clean bag with alcohol before changing. Pinch off the  rubber catheter to avoid urine spillage during the disconnection. Disconnect the dirty bag and connect the clean one. Empty the dirty bag carefully to avoid a urine spill. Attach the new bag to the leg with tape or a leg strap. Cleaning the Drainage Bags: Whenever a drainage bag is disconnected, it must be cleaned quickly so it is ready for the next use. Wash the bag in warm, soapy water. Rinse the bag thoroughly with warm water. Soak the bag for 30 minutes in a solution of white vinegar and water (1 cup vinegar to 1 quart warm water). Rinse with warm water. SEEK MEDICAL CARE IF:  Some pain develops in the kidney (lower back) area. The urine is cloudy or smells bad. There is some blood in the urine. The catheter becomes clogged and/or there is no urine drainage. SEEK IMMEDIATE MEDICAL CARE IF:  You have moderate or severe pain in the kidney region. You start to throw up (vomit). Blood fills the tube. Worsening belly (abdominal) pain develops. You have a fever. MAKE SURE YOU:  Understand these instructions. Will watch your condition. Will get help right away if you are not doing well or get worse.  Call [facility] if you have any questions or concerns: [phone]

## 2022-04-09 ENCOUNTER — Encounter (HOSPITAL_COMMUNITY): Payer: Self-pay | Admitting: Urology

## 2022-05-28 ENCOUNTER — Emergency Department (HOSPITAL_BASED_OUTPATIENT_CLINIC_OR_DEPARTMENT_OTHER)
Admission: EM | Admit: 2022-05-28 | Discharge: 2022-05-28 | Disposition: A | Discharge status: Home / Self Care | Payer: Medicare Other | Attending: Emergency Medicine | Admitting: Emergency Medicine

## 2022-05-28 ENCOUNTER — Other Ambulatory Visit: Payer: Self-pay

## 2022-05-28 ENCOUNTER — Encounter (HOSPITAL_BASED_OUTPATIENT_CLINIC_OR_DEPARTMENT_OTHER): Payer: Self-pay

## 2022-05-28 DIAGNOSIS — R58 Hemorrhage, not elsewhere classified: Secondary | ICD-10-CM | POA: Diagnosis not present

## 2022-05-28 DIAGNOSIS — T148XXA Other injury of unspecified body region, initial encounter: Secondary | ICD-10-CM

## 2022-05-28 NOTE — ED Provider Notes (Signed)
[facility] Provider Note   CSN: [id number] Arrival date & time: 05/28/22  2126     History  Chief Complaint  Patient presents with   Post-op Problem    Brad Davis is a 67 y.o. male.  The history is provided by the patient.  He has history of polycythemia and had a basal cell carcinoma removed from his anterior chest this afternoon.  This evening, he noted that the dressing was soaked with blood and the came in for evaluation of whether the wound needed any attention.  He denies any pain or fever or chills.  He is not taking any anticoagulants or antiplatelet agents.   Home Medications Prior to Admission medications   Medication Sig Start Date End Date Taking? Authorizing Provider  Ascorbic Acid (VITAMIN C WITH ROSE HIPS) 1000 MG tablet Take 1,000 mg by mouth 2 (two) times daily.    [provider]  Multiple Vitamins-Minerals (MULTIVITAMIN WITH MINERALS) tablet Take 2 tablets by mouth daily.    [provider]  NANDROLONE DECANOATE IJ Inject 210 mg as directed once a week.    [provider]  OVER THE COUNTER MEDICATION Take 5 mg by mouth daily. Creatinine 5 mg daily    [provider]  OVER THE COUNTER MEDICATION Take 2 Scoops by mouth at bedtime. Whey protein powder 2 scoops    [provider]  PRESCRIPTION MEDICATION Inject 5 Units into the skin daily. Medication: Human Growth hormone    [provider]  Tetrahydrozoline HCl (VISINE OP) Place 1 drop into both eyes 2 (two) times daily as needed (redness).    [provider]      Allergies    Chlorhexidine    Review of Systems   Review of Systems  All other systems reviewed and are negative.   Physical Exam Updated Vital Signs BP (!) 141/85 (BP Location: Right Arm)   Pulse 90   Temp 98.3 F (36.8 C) (Oral)   Resp 15   Ht '5\' 9"'$  (1.753 m)   Wt 90.7 kg   SpO2 98%   BMI 29.53 kg/m  Physical Exam Vitals  and nursing note reviewed.   67 year old male, resting comfortably and in no acute distress. Vital signs are significant for borderline elevated blood pressure. Oxygen saturation is 98%, which is normal. Head is normocephalic and atraumatic. PERRLA, EOMI.  Lungs are clear without rales, wheezes, or rhonchi. Chest: Surgical wound is noted across the anterior lower chest with no evidence of active bleeding or oozing.  Dressing had a moderate amount of blood, but was not soaked with blood. Heart has regular rate and rhythm without murmur. Abdomen is soft, flat, nontender. Extremities have no cyanosis or edema, full range of motion is present. Skin is warm and dry without rash. Neurologic: Mental status is normal, cranial nerves are intact, moves all extremities equally.    ED Results / Procedures / Treatments     Procedures Procedures    Medications Ordered in ED Medications - No data to display  ED Course/ Medical Decision Making/ A&P                             Medical Decision Making  Minimal postoperative bleeding.  Wound appears to be healing well, no active sites of oozing or bleeding which require cautery or sutures to be placed.  Patient is reassured and told to resume his  routine postsurgical wound care.  Final Clinical Impression(s) / ED Diagnoses Final diagnoses:  Bleeding from wound    Rx / DC Orders ED Discharge Orders     None         Delora Fuel, MD 76/54/65 [id number]

## 2022-05-28 NOTE — Discharge Instructions (Signed)
Resume the wound care instructions that you were given by your surgeon.  Return if you are having any problems.

## 2022-05-28 NOTE — ED Triage Notes (Addendum)
Patient here POV from Home.  Basal Carcinoma removed from chest at 1330 today. Gauze was noted to be saturated with Blood today at [facility] by the Patient and called the On-Call Team who recommended to be evaluated to be cautious.   Some Soreness to Site. Stitches appear to be intact upon assessment but bleeding somewhat.   NAD Noted during Triage. A&Ox4. GCS 15. Ambulatory.

## 2022-07-30 ENCOUNTER — Emergency Department (HOSPITAL_BASED_OUTPATIENT_CLINIC_OR_DEPARTMENT_OTHER)
Admission: EM | Admit: 2022-07-30 | Discharge: 2022-07-31 | Disposition: A | Discharge status: Home / Self Care | Payer: Medicare Other | Attending: Emergency Medicine | Admitting: Emergency Medicine

## 2022-07-30 ENCOUNTER — Other Ambulatory Visit: Payer: Self-pay

## 2022-07-30 ENCOUNTER — Encounter (HOSPITAL_BASED_OUTPATIENT_CLINIC_OR_DEPARTMENT_OTHER): Payer: Self-pay | Admitting: Emergency Medicine

## 2022-07-30 DIAGNOSIS — R339 Retention of urine, unspecified: Secondary | ICD-10-CM | POA: Diagnosis present

## 2022-07-30 DIAGNOSIS — R300 Dysuria: Secondary | ICD-10-CM | POA: Diagnosis not present

## 2022-07-30 LAB — URINALYSIS, ROUTINE W REFLEX MICROSCOPIC
Bilirubin Urine: NEGATIVE
Glucose, UA: NEGATIVE mg/dL
Ketones, ur: NEGATIVE mg/dL
Nitrite: NEGATIVE
Protein, ur: NEGATIVE mg/dL
Specific Gravity, Urine: 1.02 (ref 1.005–1.030)
pH: 5 (ref 5.0–8.0)

## 2022-07-30 LAB — URINALYSIS, MICROSCOPIC (REFLEX)

## 2022-07-30 NOTE — ED Triage Notes (Signed)
Patient here with dysuria, states he has not been able to urinate this evening.  Patient states that he is leaking and dribbles when he does go to the bathroom.

## 2022-07-31 LAB — CBC WITH DIFFERENTIAL/PLATELET
Abs Immature Granulocytes: 0.03 10*3/uL (ref 0.00–0.07)
Basophils Absolute: 0 10*3/uL (ref 0.0–0.1)
Basophils Relative: 0 %
Eosinophils Absolute: 0.1 10*3/uL (ref 0.0–0.5)
Eosinophils Relative: 1 %
HCT: 41.1 % (ref 39.0–52.0)
Hemoglobin: 12.8 g/dL — ABNORMAL LOW (ref 13.0–17.0)
Immature Granulocytes: 0 %
Lymphocytes Relative: 28 %
Lymphs Abs: 2.2 10*3/uL (ref 0.7–4.0)
MCH: 23.2 pg — ABNORMAL LOW (ref 26.0–34.0)
MCHC: 31.1 g/dL (ref 30.0–36.0)
MCV: 74.6 fL — ABNORMAL LOW (ref 80.0–100.0)
Monocytes Absolute: 0.5 10*3/uL (ref 0.1–1.0)
Monocytes Relative: 7 %
Neutro Abs: 5 10*3/uL (ref 1.7–7.7)
Neutrophils Relative %: 64 %
Platelets: 193 10*3/uL (ref 150–400)
RBC: 5.51 MIL/uL (ref 4.22–5.81)
RDW: 17.9 % — ABNORMAL HIGH (ref 11.5–15.5)
WBC: 7.7 10*3/uL (ref 4.0–10.5)
nRBC: 0 % (ref 0.0–0.2)

## 2022-07-31 LAB — BASIC METABOLIC PANEL
Anion gap: 8 (ref 5–15)
BUN: 33 mg/dL — ABNORMAL HIGH (ref 8–23)
CO2: 21 mmol/L — ABNORMAL LOW (ref 22–32)
Calcium: 8.4 mg/dL — ABNORMAL LOW (ref 8.9–10.3)
Chloride: 106 mmol/L (ref 98–111)
Creatinine, Ser: 0.87 mg/dL (ref 0.61–1.24)
GFR, Estimated: >60 mL/min (ref >60)
Glucose, Bld: 97 mg/dL (ref 70–99)
Potassium: 3.5 mmol/L (ref 3.5–5.1)
Sodium: 135 mmol/L (ref 135–145)

## 2022-07-31 MED ORDER — CEPHALEXIN 500 MG PO CAPS: 500.0000 mg | ORAL_CAPSULE | Freq: Four times a day (QID) | ORAL | 0 refills | Status: DC

## 2022-07-31 MED ORDER — TAMSULOSIN HCL 0.4 MG PO CAPS
0.4000 mg | ORAL_CAPSULE | ORAL | Status: AC
  Administered 2022-07-31: 0.4 mg via ORAL
  Filled 2022-07-31: qty 1

## 2022-07-31 MED ORDER — TAMSULOSIN HCL 0.4 MG PO CAPS: 0.4000 mg | ORAL_CAPSULE | Freq: Every day | ORAL | 0 refills | Status: DC

## 2022-07-31 MED ORDER — CEPHALEXIN 250 MG PO CAPS
500.0000 mg | ORAL_CAPSULE | Freq: Once | ORAL | Status: AC
  Administered 2022-07-31: 500 mg via ORAL
  Filled 2022-07-31: qty 2

## 2022-07-31 NOTE — ED Notes (Signed)
Pt given a leg bag at this time. Educated pt on use and how to change using aseptic technique. Pt will follow up with urology.

## 2022-07-31 NOTE — ED Provider Notes (Signed)
[facility] Provider Note   CSN: [id number] Arrival date & time: 07/30/22  2310     History  Chief Complaint  Patient presents with   Dysuria    Brad Davis is a 67 y.o. male.  The history is provided by the patient.  Dysuria Presenting symptoms: dysuria   Presenting symptoms comment:  Inability to urinate, dribbling  Context: spontaneously   Relieved by:  Nothing Worsened by:  Nothing Ineffective treatments:  None tried Associated symptoms: urinary retention   Associated symptoms: no fever and no vomiting   Risk factors: no bladder surgery   Risk factors comment:  Prostate surgery      Home Medications Prior to Admission medications   Medication Sig Start Date End Date Taking? Authorizing Provider  cephALEXin (KEFLEX) 500 MG capsule Take 1 capsule (500 mg total) by mouth 4 (four) times daily. 07/31/22  Yes Creedence Heiss, MD  tamsulosin (FLOMAX) 0.4 MG CAPS capsule Take 1 capsule (0.4 mg total) by mouth daily. 07/31/22  Yes Lizzie An, MD  Ascorbic Acid (VITAMIN C WITH ROSE HIPS) 1000 MG tablet Take 1,000 mg by mouth 2 (two) times daily.    [provider]  Multiple Vitamins-Minerals (MULTIVITAMIN WITH MINERALS) tablet Take 2 tablets by mouth daily.    [provider]  NANDROLONE DECANOATE IJ Inject 210 mg as directed once a week.    [provider]  OVER THE COUNTER MEDICATION Take 5 mg by mouth daily. Creatinine 5 mg daily    [provider]  OVER THE COUNTER MEDICATION Take 2 Scoops by mouth at bedtime. Whey protein powder 2 scoops    [provider]  PRESCRIPTION MEDICATION Inject 5 Units into the skin daily. Medication: Human Growth hormone    [provider]  Tetrahydrozoline HCl (VISINE OP) Place 1 drop into both eyes 2 (two) times daily as needed (redness).    [provider]      Allergies    Chlorhexidine    Review of Systems   Review of  Systems  Constitutional:  Negative for fever.  HENT:  Negative for facial swelling.   Eyes:  Negative for redness.  Respiratory:  Negative for wheezing and stridor.   Gastrointestinal:  Negative for vomiting.  Genitourinary:  Positive for difficulty urinating and dysuria.  All other systems reviewed and are negative.   Physical Exam Updated Vital Signs BP (!) 146/100 (BP Location: Left Arm)   Pulse 91   Temp 98.2 F (36.8 C) (Oral)   Resp 16   SpO2 97%  Physical Exam Vitals and nursing note reviewed.  Constitutional:      General: He is not in acute distress.    Appearance: Normal appearance. He is well-developed. He is not diaphoretic.  HENT:     Head: Normocephalic and atraumatic.     Nose: Nose normal.  Eyes:     Conjunctiva/sclera: Conjunctivae normal.     Pupils: Pupils are equal, round, and reactive to light.  Cardiovascular:     Rate and Rhythm: Normal rate and regular rhythm.  Pulmonary:     Effort: Pulmonary effort is normal.     Breath sounds: Normal breath sounds. No wheezing or rales.  Abdominal:     General: Bowel sounds are normal.     Palpations: Abdomen is soft.     Tenderness: There is no abdominal tenderness. There is no guarding or rebound.  Musculoskeletal:        General:  Normal range of motion.     Cervical back: Normal range of motion and neck supple.  Skin:    General: Skin is warm and dry.     Capillary Refill: Capillary refill takes less than 2 seconds.  Neurological:     General: No focal deficit present.     Mental Status: He is alert and oriented to person, place, and time.     Deep Tendon Reflexes: Reflexes normal.     ED Results / Procedures / Treatments   Labs (all labs ordered are listed, but only abnormal results are displayed) Results for orders placed or performed during the hospital encounter of 07/30/22  Urinalysis, Routine w reflex microscopic -Urine, Clean Catch  Result Value Ref Range   Color, Urine YELLOW YELLOW    APPearance CLEAR CLEAR   Specific Gravity, Urine 1.020 1.005 - 1.030   pH 5.0 5.0 - 8.0   Glucose, UA NEGATIVE NEGATIVE mg/dL   Hgb urine dipstick MODERATE (A) NEGATIVE   Bilirubin Urine NEGATIVE NEGATIVE   Ketones, ur NEGATIVE NEGATIVE mg/dL   Protein, ur NEGATIVE NEGATIVE mg/dL   Nitrite NEGATIVE NEGATIVE   Leukocytes,Ua TRACE (A) NEGATIVE  Urinalysis, Microscopic (reflex)  Result Value Ref Range   RBC / HPF 11-20 0 - 5 RBC/hpf   WBC, UA 0-5 0 - 5 WBC/hpf   Bacteria, UA RARE (A) NONE SEEN   Squamous Epithelial / HPF 0-5 0 - 5 /HPF  CBC with Differential  Result Value Ref Range   WBC 7.7 4.0 - 10.5 K/uL   RBC 5.51 4.22 - 5.81 MIL/uL   Hemoglobin 12.8 (L) 13.0 - 17.0 g/dL   HCT 41.1 39.0 - 52.0 %   MCV 74.6 (L) 80.0 - 100.0 fL   MCH 23.2 (L) 26.0 - 34.0 pg   MCHC 31.1 30.0 - 36.0 g/dL   RDW 17.9 (H) 11.5 - 15.5 %   Platelets 193 150 - 400 K/uL   nRBC 0.0 0.0 - 0.2 %   Neutrophils Relative % 64 %   Neutro Abs 5.0 1.7 - 7.7 K/uL   Lymphocytes Relative 28 %   Lymphs Abs 2.2 0.7 - 4.0 K/uL   Monocytes Relative 7 %   Monocytes Absolute 0.5 0.1 - 1.0 K/uL   Eosinophils Relative 1 %   Eosinophils Absolute 0.1 0.0 - 0.5 K/uL   Basophils Relative 0 %   Basophils Absolute 0.0 0.0 - 0.1 K/uL   Immature Granulocytes 0 %   Abs Immature Granulocytes 0.03 0.00 - 0.07 K/uL  Basic metabolic panel  Result Value Ref Range   Sodium 135 135 - 145 mmol/L   Potassium 3.5 3.5 - 5.1 mmol/L   Chloride 106 98 - 111 mmol/L   CO2 21 (L) 22 - 32 mmol/L   Glucose, Bld 97 70 - 99 mg/dL   BUN 33 (H) 8 - 23 mg/dL   Creatinine, Ser 0.87 0.61 - 1.24 mg/dL   Calcium 8.4 (L) 8.9 - 10.3 mg/dL   GFR, Estimated >60 >60 mL/min   Anion gap 8 5 - 15   No results found.  None  Radiology No results found.  Procedures Procedures    Medications Ordered in ED Medications  cephALEXin (KEFLEX) capsule 500 mg (has no administration in time range)  tamsulosin (FLOMAX) capsule 0.4 mg (0.4 mg Oral Given  07/31/22 0026)    ED Course/ Medical Decision Making/ A&P  Medical Decision Making Patient with dysuria and inability   Problems Addressed: Urinary retention:    Details: Foley placed with adequate drainage, changed to a leg bag. Follow up with urology in the office for voiding trial.    Amount and/or Complexity of Data Reviewed External Data Reviewed: notes.    Details: Previous notes reviewed  Labs: ordered.    Details: All labs reviewed: urine with small leuk esterase.  Normal white count 7.7, hemoglobin slightly low 12.8, normal platelet 193.  Normal sodium 135, normal potassium 3.5, normal creatinine .87  Risk Prescription drug management. Risk Details: Well appearing, foley inserted and putting out urine, normal creatinine.  Leg bag applied.  Follow up with urology in office for voiding trial in 7 days.      Final Clinical Impression(s) / ED Diagnoses Final diagnoses:  Urinary retention   Return for intractable cough, coughing up blood, fevers > 100.4 unrelieved by medication, shortness of breath, intractable vomiting, chest pain, shortness of breath, weakness, numbness, changes in speech, facial asymmetry, abdominal pain, passing out, Inability to tolerate liquids or food, cough, altered mental status or any concerns. No signs of systemic illness or infection. The patient is nontoxic-appearing on exam and vital signs are within normal limits.  I have reviewed the triage vital signs and the nursing notes. Pertinent labs & imaging results that were available during my care of the patient were reviewed by me and considered in my medical decision making (see chart for details). After history, exam, and medical workup I feel the patient has been appropriately medically screened and is safe for discharge home. Pertinent diagnoses were discussed with the patient. Patient was given return precautions.    Rx / DC Orders ED Discharge Orders          Ordered     tamsulosin (FLOMAX) 0.4 MG CAPS capsule  Daily        07/31/22 0127    cephALEXin (KEFLEX) 500 MG capsule  4 times daily        07/31/22 0127              Yariela Tison, MD 07/31/22 0140

## 2022-07-31 NOTE — ED Notes (Signed)
Provider at bedside to perform bladder US. Bladder volume between 500-745mL per MD. Will insert Foley.

## 2022-12-12 IMAGING — MR MR ELBOW*R* W/O CM
4 of 6 series · 19 of 40 positions shown · non-contrast
Comparison: None.

CLINICAL DATA: Prior trained bursa last year, cosmetic issue,
patient is a body builder. No pain, numbness, or weakness.

EXAM:
MRI OF THE RIGHT ELBOW WITHOUT CONTRAST
TECHNIQUE: Multiplanar, multisequence MR imaging of the elbow was performed. No
intravenous contrast was administered.

[Series 4: T2 fat-sat · axial · 3.0mm · 0.27mm/px · z∈[-76,+46]mm · 6 of 32 slices shown (1 of 4)]
[im 1/32]
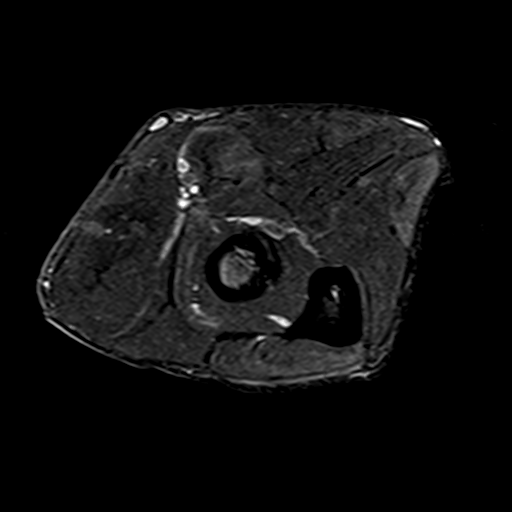
[im 7/32]
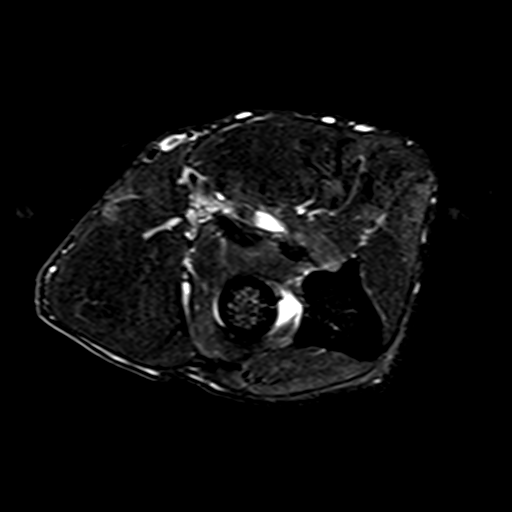
[im 13/32]
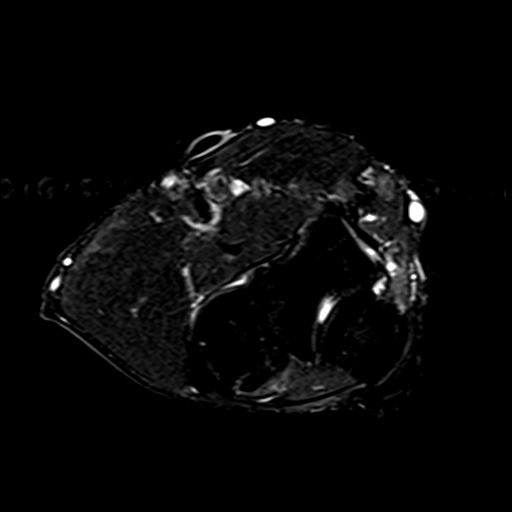
[im 19/32]
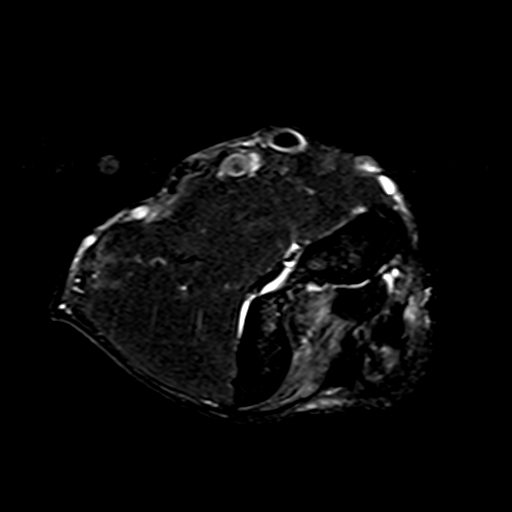
[im 25/32]
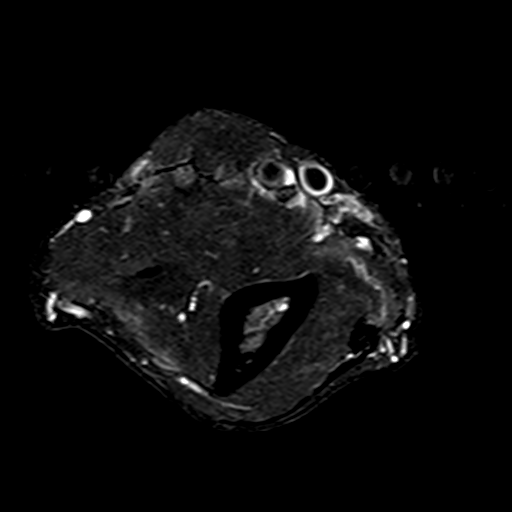
[im 32/32]
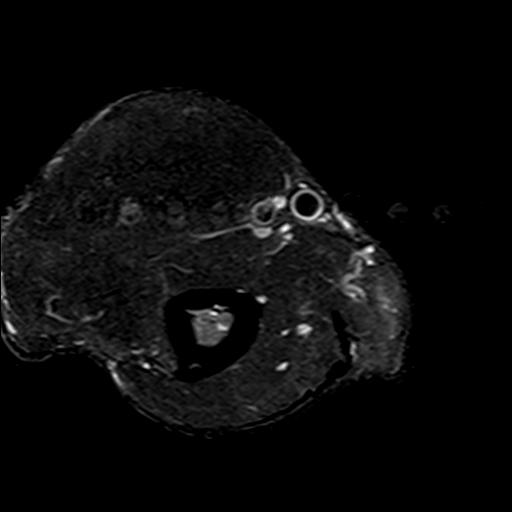

[Series 7: T2 fat-sat · oblique · 3.0mm · 0.27mm/px · 7 of 34 slices shown (2 of 4)]
[im 1/34]
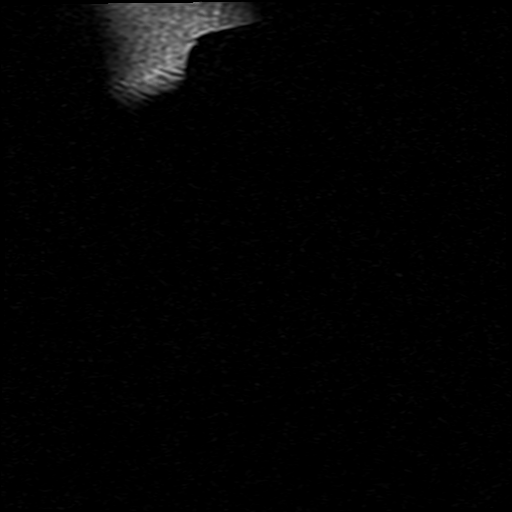
[im 6/34]
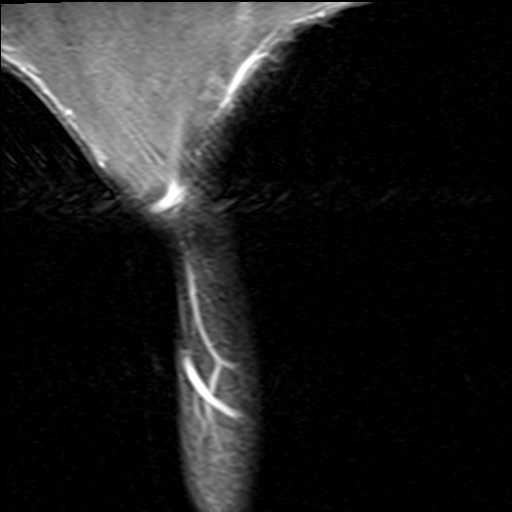
[im 12/34]
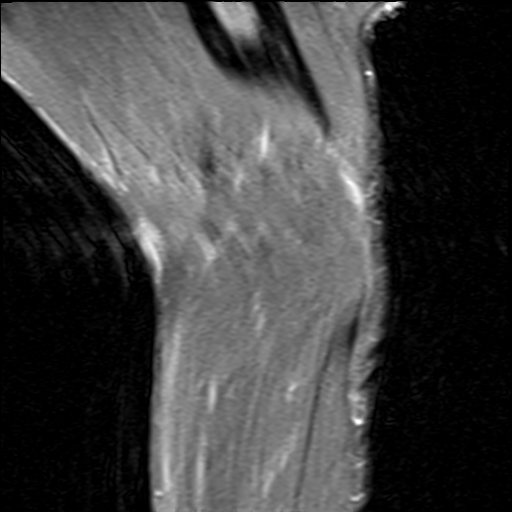
[im 17/34]
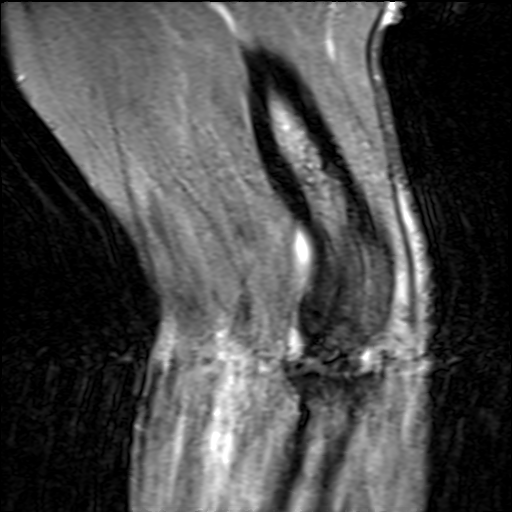
[im 23/34]
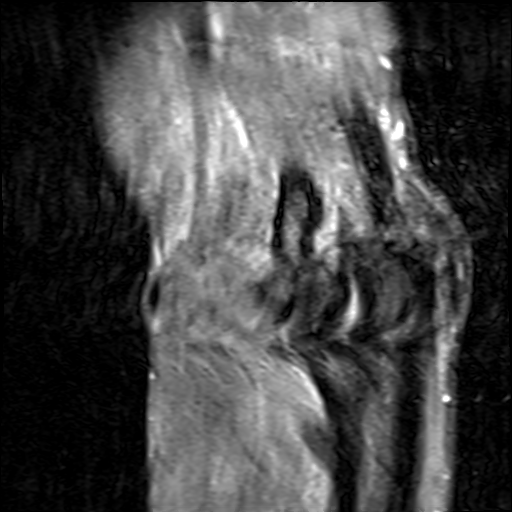
[im 28/34]
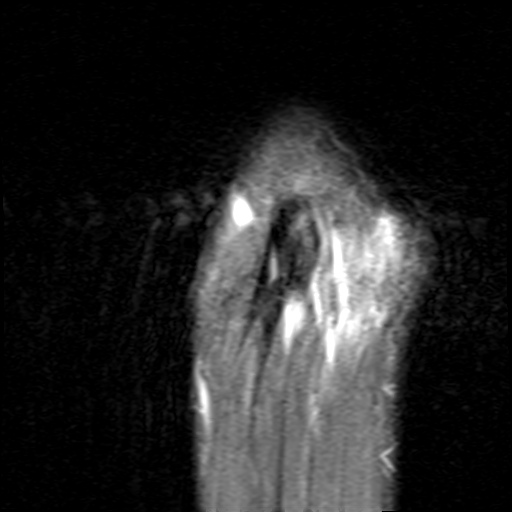
[im 34/34]
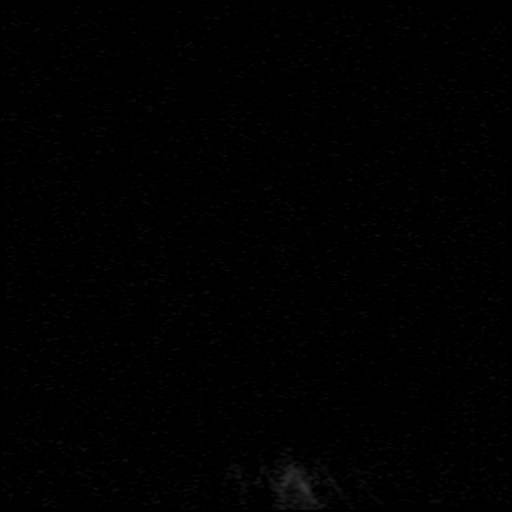

[Series 8: T2 fat-sat · oblique · 3.0mm · 0.27mm/px · 3 of 34 slices shown (3 of 4)]
[im 6/34]
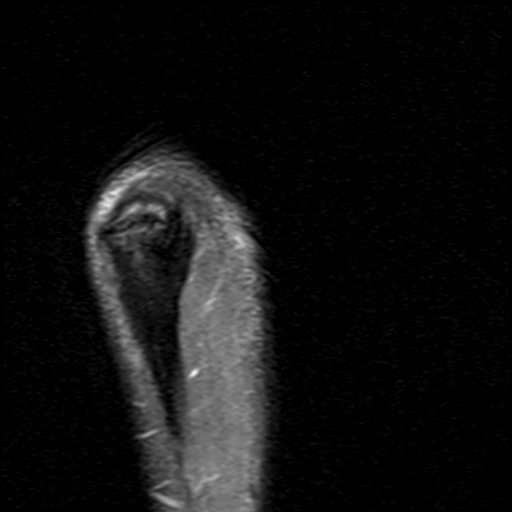
[im 17/34]
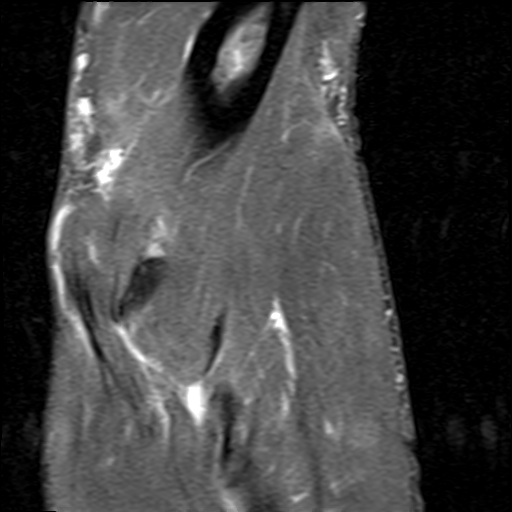
[im 28/34]
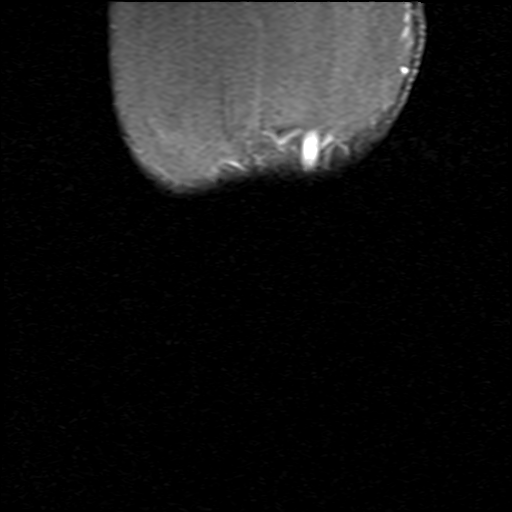

[Series 9: T2 fat-sat · oblique · 3.0mm · 0.29mm/px · 3 of 34 slices shown (4 of 4)]
[im 6/34]
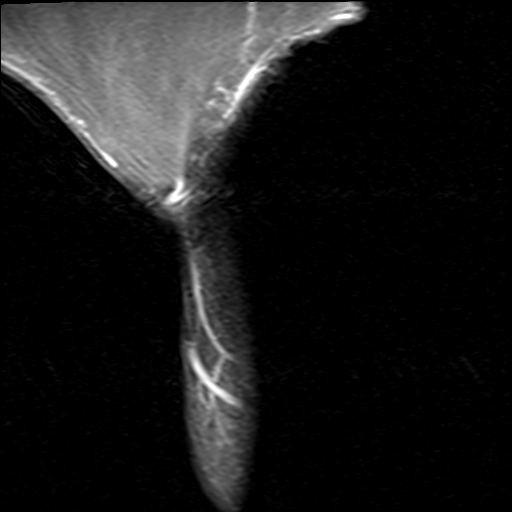
[im 17/34]
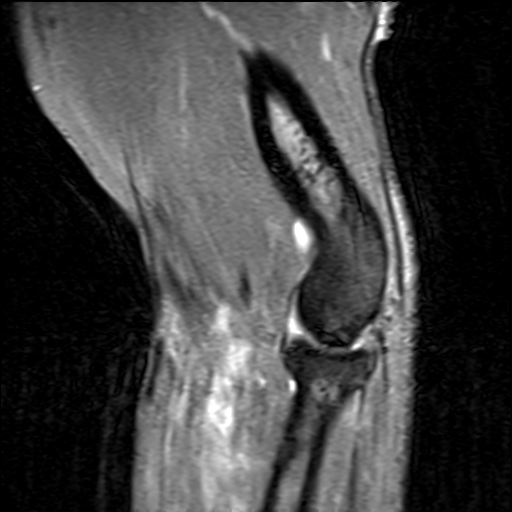
[im 28/34]
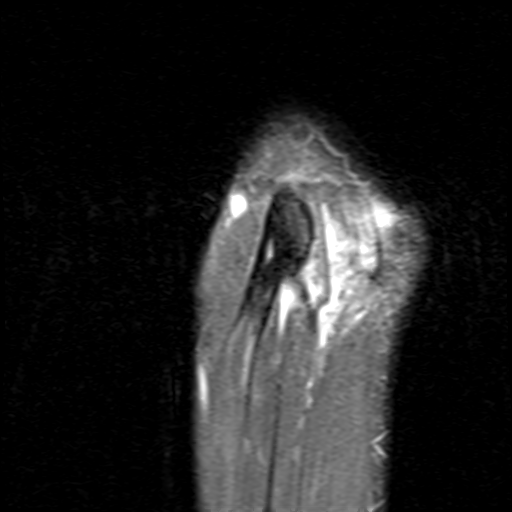

[19 of 40 positions shown; findings below may reference images not displayed]

FINDINGS: TENDONS

Common forearm flexor origin: Intact

Common forearm extensor origin: Intact

Biceps: Intact

Triceps: Intact triceps tendon, with distal insertional tendinosis
and enthesopathy at the attachment on the olecranon.

LIGAMENTS

Medial stabilizers: Intact

Lateral stabilizers:  Intact

Cartilage: No chondral defect.

Joint: No joint effusion. No synovitis.

Cubital tunnel: Normal.

Bones: No fracture or dislocation. No marrow abnormality.

Soft Tissues: There is mild, multicompartmental intramuscular edema.
There is soft tissue thickening posteriorly along the olecranon. No
focal fluid collection.
IMPRESSION: Soft tissue thickening posteriorly along the olecranon, consistent
with prior inflammatory process. No focal fluid collection.

Mild multicompartmental intramuscular edema, possibly mild myositis
or muscle strain.

Insertional distal triceps tendinopathy with enthesopathy.

## 2023-03-01 ENCOUNTER — Ambulatory Visit: Payer: Self-pay | Admitting: General Surgery

## 2023-03-01 MED ORDER — KETOROLAC TROMETHAMINE 15 MG/ML IJ SOLN: 15.0000 mg | INTRAMUSCULAR | Status: AC

## 2023-03-23 ENCOUNTER — Ambulatory Visit: Payer: Self-pay | Admitting: General Surgery

## 2023-04-12 ENCOUNTER — Encounter (HOSPITAL_BASED_OUTPATIENT_CLINIC_OR_DEPARTMENT_OTHER): Payer: Self-pay | Admitting: General Surgery

## 2023-04-12 ENCOUNTER — Other Ambulatory Visit: Payer: Self-pay

## 2023-04-16 NOTE — Progress Notes (Signed)

## 2023-04-19 ENCOUNTER — Other Ambulatory Visit: Payer: Self-pay

## 2023-04-19 ENCOUNTER — Ambulatory Visit (HOSPITAL_BASED_OUTPATIENT_CLINIC_OR_DEPARTMENT_OTHER): Payer: Medicare Other | Admitting: Anesthesiology

## 2023-04-19 ENCOUNTER — Encounter (HOSPITAL_BASED_OUTPATIENT_CLINIC_OR_DEPARTMENT_OTHER)
Admission: RE | Disposition: A | Discharge status: Home / Self Care | Payer: Self-pay | Source: Home / Self Care | Attending: General Surgery

## 2023-04-19 ENCOUNTER — Encounter (HOSPITAL_BASED_OUTPATIENT_CLINIC_OR_DEPARTMENT_OTHER): Payer: Self-pay | Admitting: General Surgery

## 2023-04-19 ENCOUNTER — Ambulatory Visit (HOSPITAL_BASED_OUTPATIENT_CLINIC_OR_DEPARTMENT_OTHER)
Admission: RE | Admit: 2023-04-19 | Discharge: 2023-04-19 | Disposition: A | Discharge status: Home / Self Care | Payer: Medicare Other | Attending: General Surgery | Admitting: General Surgery

## 2023-04-19 DIAGNOSIS — I4891 Unspecified atrial fibrillation: Secondary | ICD-10-CM | POA: Insufficient documentation

## 2023-04-19 DIAGNOSIS — K4090 Unilateral inguinal hernia, without obstruction or gangrene, not specified as recurrent: Secondary | ICD-10-CM | POA: Diagnosis not present

## 2023-04-19 DIAGNOSIS — Z538 Procedure and treatment not carried out for other reasons: Secondary | ICD-10-CM | POA: Insufficient documentation

## 2023-04-19 SURGERY — CANCELLED PROCEDURE
Anesthesia: Regional

## 2023-04-19 MED ORDER — OXYCODONE HCL 5 MG/5ML PO SOLN: 5.0000 mg | Freq: Once | ORAL | Status: DC | PRN

## 2023-04-19 MED ORDER — LACTATED RINGERS IV SOLN
INTRAVENOUS | Status: DC
Start: 2023-04-19 — End: 2023-04-19

## 2023-04-19 MED ORDER — ACETAMINOPHEN 500 MG PO TABS
ORAL_TABLET | ORAL | Status: AC
  Filled 2023-04-19: qty 2

## 2023-04-19 MED ORDER — ACETAMINOPHEN 500 MG PO TABS: 1000.0000 mg | ORAL_TABLET | Freq: Once | ORAL | Status: DC | PRN

## 2023-04-19 MED ORDER — GABAPENTIN 100 MG PO CAPS
ORAL_CAPSULE | ORAL | Status: AC
  Filled 2023-04-19: qty 1

## 2023-04-19 MED ORDER — CEFAZOLIN SODIUM-DEXTROSE 2-4 GM/100ML-% IV SOLN
INTRAVENOUS | Status: AC
  Filled 2023-04-19: qty 100

## 2023-04-19 MED ORDER — FENTANYL CITRATE (PF) 100 MCG/2ML IJ SOLN: 25.0000 ug | INTRAMUSCULAR | Status: DC | PRN

## 2023-04-19 MED ORDER — OXYCODONE HCL 5 MG PO TABS
5.0000 mg | ORAL_TABLET | Freq: Once | ORAL | Status: DC | PRN
Start: 2023-04-19 — End: 2023-04-19

## 2023-04-19 MED ORDER — FENTANYL CITRATE (PF) 100 MCG/2ML IJ SOLN
INTRAMUSCULAR | Status: AC
  Filled 2023-04-19: qty 2

## 2023-04-19 MED ORDER — FENTANYL CITRATE (PF) 250 MCG/5ML IJ SOLN
INTRAMUSCULAR | Status: DC | PRN
  Administered 2023-04-19: 50 ug via INTRAVENOUS

## 2023-04-19 MED ORDER — MIDAZOLAM HCL 2 MG/2ML IJ SOLN
INTRAMUSCULAR | Status: AC
Start: 2023-04-19 — End: ?
  Filled 2023-04-19: qty 2

## 2023-04-19 MED ORDER — ACETAMINOPHEN 160 MG/5ML PO SOLN: 1000.0000 mg | Freq: Once | ORAL | Status: DC | PRN

## 2023-04-19 MED ORDER — MIDAZOLAM HCL 5 MG/5ML IJ SOLN
INTRAMUSCULAR | Status: DC | PRN
  Administered 2023-04-19: 2 mg via INTRAVENOUS

## 2023-04-19 MED ORDER — ACETAMINOPHEN 500 MG PO TABS
1000.0000 mg | ORAL_TABLET | ORAL | Status: AC
  Administered 2023-04-19: 1000 mg via ORAL

## 2023-04-19 MED ORDER — PROPOFOL 500 MG/50ML IV EMUL
INTRAVENOUS | Status: AC
  Filled 2023-04-19: qty 50

## 2023-04-19 MED ORDER — GABAPENTIN 100 MG PO CAPS
100.0000 mg | ORAL_CAPSULE | ORAL | Status: AC
  Administered 2023-04-19: 100 mg via ORAL

## 2023-04-19 MED ORDER — ACETAMINOPHEN 10 MG/ML IV SOLN
1000.0000 mg | Freq: Once | INTRAVENOUS | Status: DC | PRN
Start: 2023-04-19 — End: 2023-04-19

## 2023-04-19 MED ORDER — SODIUM CHLORIDE 0.9 % IV SOLN: INTRAVENOUS | Status: DC | PRN

## 2023-04-19 MED ORDER — CEFAZOLIN SODIUM-DEXTROSE 2-4 GM/100ML-% IV SOLN: 2.0000 g | INTRAVENOUS | Status: DC

## 2023-04-19 SURGICAL SUPPLY — 35 items
BENZOIN TINCTURE PRP APPL 2/3 (GAUZE/BANDAGES/DRESSINGS) IMPLANT
BLADE CLIPPER SURG (BLADE) IMPLANT
BLADE HEX COATED 2.75 (ELECTRODE) ×1 IMPLANT
BLADE SURG 15 STRL LF DISP TIS (BLADE) ×1 IMPLANT
CHLORAPREP W/TINT 26 (MISCELLANEOUS) ×1 IMPLANT
COVER BACK TABLE 60X90IN (DRAPES) ×1 IMPLANT
COVER MAYO STAND STRL (DRAPES) ×1 IMPLANT
DERMABOND ADVANCED .7 DNX12 (GAUZE/BANDAGES/DRESSINGS) ×1 IMPLANT
DRAIN PENROSE .5X12 LATEX STL (DRAIN) ×1 IMPLANT
DRAPE LAPAROTOMY TRNSV 102X78 (DRAPES) ×1 IMPLANT
DRAPE UTILITY XL STRL (DRAPES) ×1 IMPLANT
ELECT REM PT RETURN 9FT ADLT (ELECTROSURGICAL)
ELECTRODE REM PT RTRN 9FT ADLT (ELECTROSURGICAL) ×1 IMPLANT
GLOVE BIO SURGEON STRL SZ7.5 (GLOVE) ×1 IMPLANT
GOWN STRL REUS W/ TWL LRG LVL3 (GOWN DISPOSABLE) ×2 IMPLANT
NDL HYPO 25X1 1.5 SAFETY (NEEDLE) ×1 IMPLANT
NEEDLE HYPO 25X1 1.5 SAFETY (NEEDLE)
NS IRRIG 1000ML POUR BTL (IV SOLUTION) ×1 IMPLANT
PACK BASIN DAY SURGERY FS (CUSTOM PROCEDURE TRAY) ×1 IMPLANT
PENCIL SMOKE EVACUATOR (MISCELLANEOUS) ×1 IMPLANT
SLEEVE SCD COMPRESS KNEE MED (STOCKING) ×1 IMPLANT
SPIKE FLUID TRANSFER (MISCELLANEOUS) IMPLANT
SPONGE T-LAP 18X18 ~~LOC~~+RFID (SPONGE) ×1 IMPLANT
STRIP CLOSURE SKIN 1/2X4 (GAUZE/BANDAGES/DRESSINGS) IMPLANT
SUT MON AB 4-0 PC3 18 (SUTURE) ×1 IMPLANT
SUT PROLENE 2 0 SH DA (SUTURE) ×2 IMPLANT
SUT SILK 2 0 SH (SUTURE) IMPLANT
SUT SILK 3 0 SH 30 (SUTURE) IMPLANT
SUT SILK 3-0 18XBRD TIE BLK (SUTURE) ×1 IMPLANT
SUT VIC AB 0 CT1 27XBRD ANBCTR (SUTURE) IMPLANT
SUT VIC AB 2-0 SH 27XBRD (SUTURE) ×1 IMPLANT
SUT VIC AB 3-0 SH 27X BRD (SUTURE) ×1 IMPLANT
SUT VICRYL 0 SH 27 (SUTURE) IMPLANT
SYR CONTROL 10ML LL (SYRINGE) ×1 IMPLANT
TOWEL GREEN STERILE FF (TOWEL DISPOSABLE) ×2 IMPLANT

## 2023-04-19 NOTE — H&P (Signed)
REFERRING PHYSICIAN: Gwen Pounds, MD PROVIDER: Lindell Noe, MD MRN: [id number] DOB: 01-10-1956 Subjective   Chief Complaint: New Consultation  History of Present Illness: Brad Davis is a 67 y.o. male who is seen today as an office consultation for evaluation of New Consultation  We are asked to see the patient in consultation by Dr. Creola Corn to evaluate him for a right inguinal hernia. The patient is a 67 year old white male who has been experiencing some right groin discomfort off and on for the last few months. He will at times notice a small bulge in the right groin. He denies any nausea or vomiting. He denies any fevers or chills. His appetite is good and his bowels are working normally. He is otherwise in good shape and does not smoke.  Review of Systems: A complete review of systems was obtained from the patient. I have reviewed this information and discussed as appropriate with the patient. See HPI as well for other ROS.  ROS   Medical History: History reviewed. No pertinent past medical history.  Patient Active Problem List  Diagnosis  Right inguinal hernia   History reviewed. No pertinent surgical history.   No Known Allergies  Current Outpatient Medications on File Prior to Visit  Medication Sig Dispense Refill  anastrozole (ARIMIDEX) 1 mg tablet Take 1 mg by mouth once daily  finasteride (PROSCAR) 5 mg tablet Take 5 mg by mouth once daily  meloxicam (MOBIC) 15 MG tablet Take 15 mg by mouth once daily as needed for Pain  tadalafiL (CIALIS) 20 MG tablet Take 20 mg by mouth once daily as needed   No current facility-administered medications on file prior to visit.   Family History  Problem Relation Age of Onset  Stroke Mother  Skin cancer Mother  High blood pressure (Hypertension) Mother  Breast cancer Mother    Social History   Tobacco Use  Smoking Status Never  Smokeless Tobacco Never    Social History   Socioeconomic History  Marital  status: Divorced  Tobacco Use  Smoking status: Never  Smokeless tobacco: Never  Substance and Sexual Activity  Alcohol use: Not Currently  Drug use: Never   Social Drivers of Health   Food Insecurity: No Food Insecurity (03/24/2022)  Received from [facility]  Hunger Vital Sign  Worried About Running Out of Food in the Last Year: Never true  Ran Out of Food in the Last Year: Never true  Transportation Needs: No Transportation Needs (03/24/2022)  Received from [facility] - Transportation  Lack of Transportation (Medical): No  Lack of Transportation (Non-Medical): No   Objective:   Vitals:  BP: 125/70  Pulse: 83  Temp: 37.2 C (99 F)  SpO2: 99%  Weight: 92.5 kg (204 lb)  Height: 175.3 cm (5\' 9" )   Body mass index is 30.13 kg/m.  Physical Exam Constitutional:  General: He is not in acute distress. Appearance: Normal appearance.  HENT:  Head: Normocephalic and atraumatic.  Right Ear: External ear normal.  Left Ear: External ear normal.  Nose: Nose normal.  Mouth/Throat:  Mouth: Mucous membranes are moist.  Pharynx: Oropharynx is clear.  Eyes:  General: No scleral icterus. Extraocular Movements: Extraocular movements intact.  Conjunctiva/sclera: Conjunctivae normal.  Pupils: Pupils are equal, round, and reactive to light.  Cardiovascular:  Rate and Rhythm: Normal rate and regular rhythm.  Pulses: Normal pulses.  Heart sounds: Normal heart sounds.  Pulmonary:  Effort: Pulmonary effort is normal. No respiratory distress.  Breath sounds:  Normal breath sounds.  Abdominal:  General: Abdomen is flat. Bowel sounds are normal. There is no distension.  Palpations: Abdomen is soft.  Tenderness: There is no abdominal tenderness.  Genitourinary: Comments: There is a small impulse with straining that comes out when he coughs hard in the right groin. There is no palpable bulge or impulse with straining in the left groin Musculoskeletal:  General: No swelling  or deformity. Normal range of motion.  Cervical back: Normal range of motion and neck supple. No tenderness.  Skin: General: Skin is warm and dry.  Coloration: Skin is not jaundiced.  Neurological:  General: No focal deficit present.  Mental Status: He is alert and oriented to person, place, and time.  Psychiatric:  Mood and Affect: Mood normal.  Behavior: Behavior normal.     Labs, Imaging and Diagnostic Testing:  Assessment and Plan:   Diagnoses and all orders for this visit:  Right inguinal hernia   The patient appears to have a small but symptomatic right inguinal hernia. Because of the risk of incarceration or strangulation I feel he would benefit from having this fixed. I have discussed with him in detail the risks and benefits of the operation to fix the hernia as well as some of the technical aspects including the use of mesh and the risk of chronic pain and he understands. He would like to talk to his family first and then once he makes his decision he will let us know about scheduling.

## 2023-04-19 NOTE — Progress Notes (Signed)
Once the patient was brought into the operating room he was placed on a cardiac monitor.  He was noted to be in atrial fibrillation with heart rates between 110 and 130.  His blood pressure was in the 130s systolic.  He was asymptomatic.  At this point the operation was canceled and I placed a stat referral to cardiology to workup his new onset A-fib.  We will reschedule his operation once he has been evaluated by cardiology and cleared.

## 2023-04-19 NOTE — Transfer of Care (Signed)
Immediate Anesthesia Transfer of Care Note  Patient: Brad Davis  Procedure(s) Performed: case cancelled (Right)  Patient Location: PACU  Anesthesia Type: case cancelled, sedation given  Level of Consciousness: awake, alert , and oriented  Airway & Oxygen Therapy: Patient Spontanous Breathing  Post-op Assessment: Report given to RN and Post -op Vital signs reviewed and stable  Post vital signs: Reviewed and stable  Last Vitals:  Vitals Value Taken Time  BP 120/83 04/19/23 1015  Temp    Pulse 95 04/19/23 1023  Resp 16 04/19/23 1023  SpO2 93 % 04/19/23 1023  Vitals shown include unfiled device data.  Last Pain:  Vitals:   04/19/23 0841  TempSrc: Oral  PainSc: 1       Patients Stated Pain Goal: 8 (04/19/23 0841)  Complications: No notable events documented.

## 2023-04-19 NOTE — Discharge Instructions (Signed)
  Post Anesthesia Home Care Instructions  Activity: Get plenty of rest for the remainder of the day. A responsible individual must stay with you for 24 hours following the procedure.  For the next 24 hours, DO NOT: -Drive a car -Advertising copywriter -Drink alcoholic beverages -Take any medication unless instructed by your physician -Make any legal decisions or sign important papers.  Meals: Start with liquid foods such as gelatin or soup. Progress to regular foods as tolerated. Avoid greasy, spicy, heavy foods. If nausea and/or vomiting occur, drink only clear liquids until the nausea and/or vomiting subsides. Call your physician if vomiting continues.  Special Instructions/Symptoms: Your throat may feel dry or sore from the anesthesia or the breathing tube placed in your throat during surgery. If this causes discomfort, gargle with warm salt water. The discomfort should disappear within 24 hours.  If you had a scopolamine patch placed behind your ear for the management of post- operative nausea and/or vomiting:  1. The medication in the patch is effective for 72 hours, after which it should be removed.  Wrap patch in a tissue and discard in the trash. Wash hands thoroughly with soap and water. 2. You may remove the patch earlier than 72 hours if you experience unpleasant side effects which may include dry mouth, dizziness or visual disturbances. 3. Avoid touching the patch. Wash your hands with soap and water after contact with the patch.     No Tylenol until after 2:45pm today, if needed.

## 2023-04-19 NOTE — Anesthesia Preprocedure Evaluation (Addendum)
Anesthesia Evaluation  Patient identified by MRN, date of birth, ID band Patient awake    Reviewed: Allergy & Precautions, NPO status , Patient's Chart, lab work & pertinent test results  History of Anesthesia Complications Negative for: history of anesthetic complications  Airway Mallampati: I  TM Distance: >3 FB Neck ROM: Full    Dental  (+) Teeth Intact, Dental Advisory Given   Pulmonary neg pulmonary ROS   breath sounds clear to auscultation       Cardiovascular negative cardio ROS  Rhythm:Regular     Neuro/Psych negative neurological ROS  negative psych ROS   GI/Hepatic negative GI ROS, Neg liver ROS,,,  Endo/Other  negative endocrine ROS    Renal/GU negative Renal ROS     Musculoskeletal  (+) Arthritis ,    Abdominal   Peds  Hematology negative hematology ROS (+)   Anesthesia Other Findings   Reproductive/Obstetrics                             Anesthesia Physical Anesthesia Plan  ASA: 1  Anesthesia Plan: General and Regional   Post-op Pain Management: Regional block*, Tylenol PO (pre-op)* and Gabapentin PO (pre-op)*   Induction: Intravenous  PONV Risk Score and Plan: 2 and Ondansetron and Dexamethasone  Airway Management Planned: LMA and Oral ETT  Additional Equipment: None  Intra-op Plan:   Post-operative Plan: Extubation in OR  Informed Consent: I have reviewed the patients History and Physical, chart, labs and discussed the procedure including the risks, benefits and alternatives for the proposed anesthesia with the patient or authorized representative who has indicated his/her understanding and acceptance.     Dental advisory given  Plan Discussed with: CRNA  Anesthesia Plan Comments:         Anesthesia Quick Evaluation

## 2023-04-19 NOTE — Anesthesia Postprocedure Evaluation (Signed)
Anesthesia Post Note  Patient: Brad Davis  Procedure(s) Performed: case cancelled (Right)     Patient location during evaluation: PACU Level of consciousness: awake and alert Pain management: pain level controlled Vital Signs Assessment: post-procedure vital signs reviewed and stable Respiratory status: spontaneous breathing, nonlabored ventilation and respiratory function stable Cardiovascular status: stable and blood pressure returned to baseline Postop Assessment: no apparent nausea or vomiting Anesthetic complications: no Comments: Case cancelled for new onset afib presenting on OR table prior to case start   No notable events documented.  Last Vitals:  Vitals:   04/19/23 1015 04/19/23 1040  BP: 120/83 111/78  Pulse: (!) 113 86  Resp: 18 18  Temp:  37.4 C  SpO2: 94% 96%    Last Pain:  Vitals:   04/19/23 1040  TempSrc:   PainSc: 0-No pain                 Lyrika Souders

## 2023-04-19 NOTE — Interval H&P Note (Signed)
History and Physical Interval Note:  04/19/2023 9:46 AM  Brad Davis  has presented today for surgery, with the diagnosis of RIGHT INGUINAL HERNIA.  The various methods of treatment have been discussed with the patient and family. After consideration of risks, benefits and other options for treatment, the patient has consented to  Procedure(s): OPEN RIGHT INGUINAL HERNIA REPAIR WITH MESH (Right) as a surgical intervention.  The patient's history has been reviewed, patient examined, no change in status, stable for surgery.  I have reviewed the patient's chart and labs.  Questions were answered to the patient's satisfaction.     Chevis Pretty III

## 2023-04-20 ENCOUNTER — Telehealth: Payer: Self-pay | Admitting: Internal Medicine

## 2023-04-20 ENCOUNTER — Other Ambulatory Visit: Payer: Self-pay

## 2023-04-20 ENCOUNTER — Ambulatory Visit: Payer: Medicare Other | Attending: Internal Medicine | Admitting: Internal Medicine

## 2023-04-20 ENCOUNTER — Encounter: Payer: Self-pay | Admitting: Internal Medicine

## 2023-04-20 ENCOUNTER — Ambulatory Visit (INDEPENDENT_AMBULATORY_CARE_PROVIDER_SITE_OTHER): Payer: Medicare Other

## 2023-04-20 VITALS — BP 140/86 | HR 87 | Ht 69.0 in | Wt 199.2 lb

## 2023-04-20 DIAGNOSIS — I493 Ventricular premature depolarization: Secondary | ICD-10-CM

## 2023-04-20 DIAGNOSIS — I491 Atrial premature depolarization: Secondary | ICD-10-CM | POA: Diagnosis not present

## 2023-04-20 DIAGNOSIS — I4891 Unspecified atrial fibrillation: Secondary | ICD-10-CM | POA: Diagnosis not present

## 2023-04-20 DIAGNOSIS — R0989 Other specified symptoms and signs involving the circulatory and respiratory systems: Secondary | ICD-10-CM

## 2023-04-20 MED ORDER — APIXABAN 5 MG PO TABS: 5.0000 mg | ORAL_TABLET | Freq: Two times a day (BID) | ORAL | 3 refills | Status: DC

## 2023-04-20 MED ORDER — CARVEDILOL 3.125 MG PO TABS: 3.1250 mg | ORAL_TABLET | Freq: Two times a day (BID) | ORAL | 3 refills | Status: DC

## 2023-04-20 MED ORDER — APIXABAN 5 MG PO TABS: 5.0000 mg | ORAL_TABLET | Freq: Two times a day (BID) | ORAL | Status: DC

## 2023-04-20 NOTE — Progress Notes (Unsigned)
Enrolled patient for a 14 day Zio XT  monitor to be mailed to patients home  °

## 2023-04-20 NOTE — Patient Instructions (Addendum)
Medication Instructions:  Your physician has recommended you make the following change in your medication:  START: carvedilol (Coreg) 3.125 mg by mouth twice daily  START: apixaban (Eliquis) 5 mg by mouth twice daily; We have provided you with 28 days worth of samples and a 30 day free trial offer.    *If you need a refill on your cardiac medications before your next appointment, please call your pharmacy*   Lab Work: IN 2 WEEKS: TSH, LDL Direct, LPA, CBC  If you have labs (blood work) drawn today and your tests are completely normal, you will receive your results only by: MyChart Message (if you have MyChart) OR A paper copy in the mail If you have any lab test that is abnormal or we need to change your treatment, we will call you to review the results.   Testing/Procedures: Your physician has requested that you have an echocardiogram. Echocardiography is a painless test that uses sound waves to create images of your heart. It provides your doctor with information about the size and shape of your heart and how well your heart's chambers and valves are working. This procedure takes approximately one hour. There are no restrictions for this procedure. Please do NOT wear cologne, perfume, aftershave, or lotions (deodorant is allowed). Please arrive 15 minutes prior to your appointment time.  Please note: We ask at that you not bring children with you during ultrasound (echo/ vascular) testing. Due to room size and safety concerns, children are not allowed in the ultrasound rooms during exams. Our front office staff cannot provide observation of children in our lobby area while testing is being conducted. An adult accompanying a patient to their appointment will only be allowed in the ultrasound room at the discretion of the ultrasound technician under special circumstances. We apologize for any inconvenience.   Your physician has requested that you wear a heart monitor.   Your physician has  requested that you have a carotid duplex. This test is an ultrasound of the carotid arteries in your neck. It looks at blood flow through these arteries that supply the brain with blood. Allow one hour for this exam. There are no restrictions or special instructions.    Follow-Up: At [facility], you and your health needs are our priority.  As part of our continuing mission to provide you with exceptional heart care, we have created designated Provider Care Teams.  These Care Teams include your primary Cardiologist (physician) and Advanced Practice Providers (APPs -  Physician Assistants and Nurse Practitioners) who all work together to provide you with the care you need, when you need it.  We recommend signing up for the patient portal called "MyChart".  Sign up information is provided on this After Visit Summary.  MyChart is used to connect with patients for Virtual Visits (Telemedicine).  Patients are able to view lab/test results, encounter notes, upcoming appointments, etc.  Non-urgent messages can be sent to your provider as well.   To learn more about what you can do with MyChart, go to [email or web address].    Your next appointment:   1 month(s)  Provider:   Riley Lam, MD or Ronie Spies, PA-C or Tereso Newcomer, PA-C       Other Instructions Brad Davis- Long Term Monitor Instructions  Your physician has requested you wear a ZIO patch monitor for 14 days.  This is a single patch monitor. [employer] supplies one patch monitor per enrollment. Additional stickers are not available. Please do not apply patch  if you will be having a Nuclear Stress Test,   Cardiac CT, MRI, or Chest Xray during the period you would be wearing the  monitor. The patch cannot be worn during these tests. You cannot remove and re-apply the  ZIO XT patch monitor.  Your ZIO patch monitor will be mailed 3 day USPS to your address on file. It may take 3-5 days  to receive your monitor after you have been  enrolled.  Once you have received your monitor, please review the enclosed instructions. Your monitor  has already been registered assigning a specific monitor serial # to you.  Billing and Patient Assistance Program Information  We have supplied [employer] with any of your insurance information on file for billing purposes. [employer] offers a sliding scale Patient Assistance Program for patients that do not have  insurance, or whose insurance does not completely cover the cost of the ZIO monitor.  You must apply for the Patient Assistance Program to qualify for this discounted rate.  To apply, please call [employer] at [phone], select option 4, select option 2, ask to apply for  Patient Assistance Program. Meredeth Ide will ask your household income, and how many people  are in your household. They will quote your out-of-pocket cost based on that information.  [employer] will also be able to set up a 7-month, interest-free payment plan if needed.  Applying the monitor   Shave hair from upper left chest.  Hold abrader disc by orange tab. Rub abrader in 40 strokes over the upper left chest as  indicated in your monitor instructions.  Clean area with 4 enclosed alcohol pads. Let dry.  Apply patch as indicated in monitor instructions. Patch will be placed under collarbone on left  side of chest with arrow pointing upward.  Rub patch adhesive wings for 2 minutes. Remove white label marked "1". Remove the white  label marked "2". Rub patch adhesive wings for 2 additional minutes.  While looking in a mirror, press and release button in center of patch. A small green light will  flash 3-4 times. This will be your only indicator that the monitor has been turned on.  Do not shower for the first 24 hours. You may shower after the first 24 hours.  Press the button if you feel a symptom. You will hear a small click. Record Date, Time and  Symptom in the Patient Logbook.  When you are ready to remove the  patch, follow instructions on the last 2 pages of Patient  Logbook. Stick patch monitor onto the last page of Patient Logbook.  Place Patient Logbook in the blue and white box. Use locking tab on box and tape box closed  securely. The blue and white box has prepaid postage on it. Please place it in the mailbox as  soon as possible. Your physician should have your test results approximately 7 days after the  monitor has been mailed back to [facility].  Call [facility] Customer Care at [phone] if you have questions regarding  your ZIO XT patch monitor. Call them immediately if you see an orange light blinking on your  monitor.  If your monitor falls off in less than 4 days, contact our Monitor department at [phone].  If your monitor becomes loose or falls off after 4 days call [employer] at [phone] for  suggestions on securing your monitor

## 2023-04-20 NOTE — Addendum Note (Signed)
Addended by: Macie Burows on: 04/20/2023 11:58 AM   Modules accepted: Orders

## 2023-04-20 NOTE — Telephone Encounter (Signed)
Pt c/o medication issue:  1. Name of Medication:   apixaban (ELIQUIS) 5 MG TABS tablet    meloxicam (MOBIC) 15 MG tablet   2. How are you currently taking this medication (dosage and times per day)?   3. Are you having a reaction (difficulty breathing--STAT)? No  4. What is your medication issue? Pt states he can not take both medications together. Please advise

## 2023-04-20 NOTE — Telephone Encounter (Signed)
Called pt reports takes meloxicam 15 mg PO every day for inflammation and arthritis.  Stopped med for 7 days prior to planned hernia surgery.  Had increased cramps and could barely move. Advised will send to MD to pharm D to advise.

## 2023-04-20 NOTE — Progress Notes (Signed)
Cardiology Office Note:  .    Date:  04/20/2023  ID:  Dalene Seltzer Stipe, DOB 1955-10-26, MRN [id number] PCP: Creola Corn, MD  [facility] Providers Cardiologist:  None     CC: new AF Consulted for the evaluation of new atrial fibrillation at the behest of Dr. Timothy Lasso  History of Present Illness: .    Brad Davis is a 67 y.o. male with a history of new, asymptomatic AF.   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 The patient denied experiencing any chest pain, pressure, tightness, or stinging during the atrial fibrillation event.  In the past four weeks, the patient reported a couple of episodes of lightheadedness, which he attributed to low blood sugar. Additionally, he experienced two episodes of temporary vision loss, once in each eye, described as a curtain falling over his vision that resolved within a few minutes. These episodes were painless. The patient denied any palpitations and was unaware of any PVCs or PACs. He reported a home blood pressure of 120/80.  Physical examination revealed bilateral Homero Fellers sign and a systolic murmur. The patient's past medical history includes controlled hypertension, attributed to white coat syndrome, and a low LDL level. The patient's CHA2DS2-VASc score is one, potentially increasing to two if vascular disease is present.  The patient's recent episodes of temporary vision loss, described as amaurosis fugax, prompted the initiation of anticoagulation therapy. The patient also has a systolic heart murmur and paroxysmal atrial fibrillation.   Relevant histories: .  Social- his mother is my neighbor ROS: As per HPI.   Physical Exam:    VS:  BP (!) 140/86 (BP Location: Right Arm, Patient Position: Sitting, Cuff Size: Large)   Pulse 87   Ht 5'  9" (1.753 m)   Wt 199 lb 3.2 oz (90.4 kg)   SpO2 95%   BMI 29.42 kg/m    Wt Readings from Last 3 Encounters:  04/20/23 199 lb 3.2 oz (90.4 kg)  04/19/23 194 lb 14.2 oz (88.4 kg)  05/28/22 199 lb 15.3 oz (90.7 kg)    Gen: no distress, muscular   Neck: No JVD, bilateral carotid bruit Ears: bilateral Homero Fellers Sign Cardiac: No Rubs or Gallops, systolic Murmur, RRR +2 radial pulses Respiratory: Clear to auscultation bilaterally, normal effort, normal  respiratory rate GI: Soft, nontender, non-distended  MS: No  edema;  moves all extremities Integument: Skin feels warm Neuro:  At time of evaluation, alert and oriented to person/place/time/situation  Psych: Normal affect, patient feels well   ASSESSMENT AND PLAN: .    Paroxsymal Atrial Fibrillation, PVCs and PACs - Risk factors include age - CHADSVASC=1. - HASBLED= 1 - given his visual changes of un clear etiology will consider this an TIA equivalent and start DOAC; if he rules out we may return to ASA instead - CBC and TSH in 2 weeks - with no evidence of mod/severe MS, rheumatic disease, or mechanical valve - Echo - Order Zio patch for AFib burden assessment  Amaurosis Fugax Carotid Bruit Two episodes of temporary vision loss described as a curtain falling over vision, resolving within minutes and painless. Possible association with atrial fibrillation. Importance of ruling out carotid artery disease and potential ophthalmology evaluation discussed. - Order bilateral carotid duplex - Refer to ophthalmologist if no carotid disease - Start anticoagulation therapy - Order CBC in two  weeks  Systolic Heart Murmur Systolic murmur noted on exam. Potential association with paroxysmal atrial fibrillation. Echocardiogram needed to assess structural heart disease. - Order echocardiogram  General Health Maintenance HLD Blood pressure well-controlled without medications. LDL noted to be low. - Order TSH, LDL, cholesterol, and LP(a)  tests   Low T and cardiovascular risk - based on the TRANSVERSE Trial from NEJM 2023 - Testosterone therapy in middle-aged and older men with hypogonadism compared to placebo does not increase major cardiovascular events. - The incidence of pulmonary embolism, nonfatal arrhythmia, atrial fibrillation, and acute injury was higher in subjects who received testosterone replacement therapy. - if this is pursued after our initial work up, we would set an LDL goal of < 55 and BP goal < 130/80  Follow-up - Schedule follow-up with Me or PA (Dayna Dunn or Tereso Newcomer) in January after testing is completed - Contact Dr. Carolynne Edouard in January to consider low-risk surgery if AFib burden is minimal and asymptomatic.   Riley Lam, MD FASE [facility] Cardiologist [facility]  [address] [place], [address] [place], [place] [address] [phone]  11:41 AM

## 2023-04-21 NOTE — Telephone Encounter (Signed)
Yes, there is an increased bleed risk when used with an anticoagulant. Looks like there is a possibility he come off Eliquis. This needs to be discussed with Dr. Salena Saner. May need to discuss alternative pain medication with prescribing MD. Such as tylenol or a topical product

## 2023-04-23 NOTE — Telephone Encounter (Signed)
Called pt advised of MD response. Pt reports can not stop meloxicam d/t inflammation and pain when not on med.  Recently stopped med for 7 days prior to surgery and could barely move.  Doesn't know how he would function off of meloxicam. Reports Dr. Everardo Pacific at [facility] and [facility] prescribed medication.  Also reports eye issues only happened twice and it has been a while (year(s)) since it last occurred. Advised will send concern to MD to review.

## 2023-04-25 DIAGNOSIS — I491 Atrial premature depolarization: Secondary | ICD-10-CM | POA: Diagnosis not present

## 2023-04-25 DIAGNOSIS — I493 Ventricular premature depolarization: Secondary | ICD-10-CM

## 2023-04-25 DIAGNOSIS — R0989 Other specified symptoms and signs involving the circulatory and respiratory systems: Secondary | ICD-10-CM | POA: Diagnosis not present

## 2023-04-25 DIAGNOSIS — I4891 Unspecified atrial fibrillation: Secondary | ICD-10-CM

## 2023-04-30 NOTE — Telephone Encounter (Signed)
Advised pt of MD response: I am happy to hear that this is a different description of his symptoms from our visit- I am optimistic about his care.  - I do recommend that he work with his medical team to wean of Meloxicam- if we were to find vascular disease on his testing his stroke risk would lead to a reocmmendation of starting anticoagulation; this and meloxicam increase his risk of bleeding  - we will see how his studies turn out to best evaluated his stroke risks   Pt reports will call Dr. Austin Miles office and advise of this information.  Pt will update our office with providers response.

## 2023-04-30 NOTE — Telephone Encounter (Signed)
Left a message to call back.

## 2023-05-03 LAB — CBC

## 2023-05-04 LAB — LIPOPROTEIN A (LPA): Lipoprotein (a): 22.5 nmol/L (ref <75.0)

## 2023-05-04 LAB — CBC
Hematocrit: 53.4 % — ABNORMAL HIGH (ref 37.5–51.0)
Hemoglobin: 16.3 g/dL (ref 13.0–17.7)
MCH: 25.7 pg — ABNORMAL LOW (ref 26.6–33.0)
MCHC: 30.5 g/dL — ABNORMAL LOW (ref 31.5–35.7)
MCV: 84 fL (ref 79–97)
Platelets: 165 10*3/uL (ref 150–450)
RBC: 6.35 x10E6/uL — ABNORMAL HIGH (ref 4.14–5.80)
RDW: 17.6 % — ABNORMAL HIGH (ref 11.6–15.4)
WBC: 5.8 10*3/uL (ref 3.4–10.8)

## 2023-05-04 LAB — TSH: TSH: 4.23 u[IU]/mL (ref 0.450–4.500)

## 2023-05-04 LAB — LDL CHOLESTEROL, DIRECT: LDL Direct: 113 mg/dL — ABNORMAL HIGH (ref 0–99)

## 2023-05-17 ENCOUNTER — Other Ambulatory Visit: Payer: Self-pay

## 2023-05-17 MED ORDER — CARVEDILOL 6.25 MG PO TABS: 6.2500 mg | ORAL_TABLET | Freq: Two times a day (BID) | ORAL | 3 refills | Status: DC

## 2023-05-17 NOTE — Progress Notes (Signed)
 05/16/2023  7:52 PM EST     Results: Multiple arrhythmias: SVT, Atrial fibrillation, and one episode of a wide complex tachycardia Plan: Increase to coreg  6.25 mg PO BID.  Further treatment may be recommend after 05/24/23 echo   Brad DELENA Leavens, MD   Order for carvedilol  (Coreg ) 6.25 mg PO BID sent to [facility] pharmacy.

## 2023-05-24 ENCOUNTER — Ambulatory Visit (HOSPITAL_COMMUNITY)
Admission: RE | Admit: 2023-05-24 | Discharge: 2023-05-24 | Disposition: A | Discharge status: Home / Self Care | Payer: Medicare Other | Source: Ambulatory Visit | Attending: Cardiovascular Disease | Admitting: Cardiovascular Disease

## 2023-05-24 ENCOUNTER — Ambulatory Visit (HOSPITAL_BASED_OUTPATIENT_CLINIC_OR_DEPARTMENT_OTHER)
Admission: RE | Admit: 2023-05-24 | Discharge: 2023-05-24 | Disposition: A | Discharge status: Home / Self Care | Payer: Medicare Other | Source: Ambulatory Visit | Attending: Internal Medicine | Admitting: Internal Medicine

## 2023-05-24 DIAGNOSIS — I493 Ventricular premature depolarization: Secondary | ICD-10-CM

## 2023-05-24 DIAGNOSIS — I4891 Unspecified atrial fibrillation: Secondary | ICD-10-CM | POA: Diagnosis not present

## 2023-05-24 DIAGNOSIS — R0989 Other specified symptoms and signs involving the circulatory and respiratory systems: Secondary | ICD-10-CM

## 2023-05-24 DIAGNOSIS — I491 Atrial premature depolarization: Secondary | ICD-10-CM

## 2023-05-24 LAB — ECHOCARDIOGRAM COMPLETE
AR max vel: 1.39 cm2
AV Area VTI: 1.42 cm2
AV Area mean vel: 1.38 cm2
AV Mean grad: 11.8 mm[Hg]
AV Peak grad: 21.4 mm[Hg]
AV Vena cont: 0.52 cm
Ao pk vel: 2.32 m/s
Area-P 1/2: 3.27 cm2
P 1/2 time: 488 ms
S' Lateral: 4.17 cm

## 2023-05-25 ENCOUNTER — Encounter: Payer: Self-pay | Admitting: Internal Medicine

## 2023-05-26 ENCOUNTER — Other Ambulatory Visit: Payer: Self-pay

## 2023-05-26 DIAGNOSIS — I4891 Unspecified atrial fibrillation: Secondary | ICD-10-CM

## 2023-05-26 NOTE — Progress Notes (Signed)
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

## 2023-05-27 DIAGNOSIS — I5020 Unspecified systolic (congestive) heart failure: Secondary | ICD-10-CM | POA: Insufficient documentation

## 2023-05-27 DIAGNOSIS — I471 Supraventricular tachycardia, unspecified: Secondary | ICD-10-CM | POA: Insufficient documentation

## 2023-05-27 DIAGNOSIS — I480 Paroxysmal atrial fibrillation: Secondary | ICD-10-CM | POA: Insufficient documentation

## 2023-05-27 DIAGNOSIS — R000 Tachycardia, unspecified: Secondary | ICD-10-CM | POA: Insufficient documentation

## 2023-05-27 DIAGNOSIS — I350 Nonrheumatic aortic (valve) stenosis: Secondary | ICD-10-CM | POA: Insufficient documentation

## 2023-05-27 HISTORY — DX: Unspecified systolic (congestive) heart failure: I50.20

## 2023-05-27 NOTE — Progress Notes (Signed)
Cardiology Office Note:    Date:  05/28/2023  ID:  Brad Davis, DOB 1956-01-06, MRN [id number] PCP: Creola Corn, MD  [facility] Providers Cardiologist:  Christell Constant, MD       Patient Profile:      Paroxysmal atrial fibrillation Aortic stenosis TTE 05/24/2023: EF 40-45, global HK, GR 1 DD, GLS -14, normal RVSF, mild RAE, trivial MR, mild AI, mild to moderate AS (V-max 250 cm/s, mean gradient 13 mmHg, DI 0.29), RAP 3 HFmrEF (heart failure with mildly reduced ejection fraction) Supraventricular Tachycardia Wide-complex tachycardia Monitor 04/2023: Frequent SVT and atrial fibrillation (longest 17 seconds), wide-complex tachycardia (22 beats), frequent PACs, rare PVCs Amaurosis fugax Carotid US 05/24/23: Mild thickening, no stenosis Low testosterone Hyperlipidemia LP(a) 22.5 Coronary CTA 08/09/2006: Normal coronary arteries, CAC score 0        History of Present Illness:  Discussed the use of AI scribe software for clinical note transcription with the patient, who gave verbal consent to proceed.  Brad Davis is a 68 y.o. male who returns for follow-up of A-fib, CHF.  He was evaluated by Dr. Raynelle Jan in December 2024 for new diagnosis of atrial fibrillation.  He had had some visual changes that sounded consistent with amaurosis fugax.  He was referred to ophthalmology.  He was placed on apixaban 5 mg twice daily given concern for prior TIA.  He was also placed on carvedilol.  Follow-up echocardiogram demonstrated mildly reduced LV function with EF 40-45, mild to moderate aortic stenosis and mild aortic insufficiency.  Monitor demonstrated frequent SVT and atrial fibrillation as well as wide-complex tachycardia.  Carvedilol dose was increased to 6.25 mg twice daily. He is here alone. He has not had any further visual changes. These episodes, which occurred twice, lasted only a few minutes and did not involve any loss of vision, speech difficulties, or motor  weakness. The last episode was reported a few months ago. The patient reports that he cannot discontinue meloxicam due to its effectiveness in managing his pain. The patient denies any chest discomfort or shortness of breath. He reports feeling a sensation similar to a sugar drop while at the gym, which resolved after starting carvedilol. The patient maintains an active lifestyle, lifting weights daily with no reported difficulty or breathlessness. He denies any history of smoking, alcohol, or drug use. The patient's family history is significant for a mild stroke in his mother. The patient was scheduled for hernia surgery, which was postponed due to the discovery of atrial fibrillation. The patient reports no history of bleeding issues, such as bloody or black stools.     ROS-See HPI    Studies Reviewed:            Risk Assessment/Calculations:    CHA2DS2-VASc Score = 4   This indicates a 4.8% annual risk of stroke. The patient's score is based upon: CHF History: 1 HTN History: 0 Diabetes History: 0 Stroke History: 2 Vascular Disease History: 0 Age Score: 1 Gender Score: 0            Physical Exam:   VS:  BP 110/70   Pulse 86   Ht 5\' 9"  (1.753 m)   Wt 203 lb 9.6 oz (92.4 kg)   SpO2 98%   BMI 30.07 kg/m    Wt Readings from Last 3 Encounters:  05/28/23 203 lb 9.6 oz (92.4 kg)  04/20/23 199 lb 3.2 oz (90.4 kg)  04/19/23 194 lb 14.2 oz (88.4 kg)  Constitutional:      Appearance: Healthy appearance. Not in distress.  Neck:     Vascular: JVD normal.  Pulmonary:     Effort: Pulmonary effort is normal.     Breath sounds: No wheezing. No rales.  Cardiovascular:     Normal rate. Regular rhythm.     Murmurs: There is a grade 2/6 systolic murmur at the URSB.  Edema:    Peripheral edema absent.  Abdominal:     Palpations: Abdomen is soft.        Assessment and Plan:   Assessment & Plan Paroxysmal A-fib (HCC) CHADS2-VASc at least 2. Vision changes in the past are not  clearly amaurosis fugax. But he does need anticoagulation. We discussed the importance of this.  He takes meloxicam on a regular basis and cannot function without it.  We discussed the increased risk of GI bleeding with this combination.  I have offered him a PPI to help with GI protection.  We discussed the importance of monitoring for bleeding. -Restart Eliquis 5 mg twice daily -Continue carvedilol 6.25 mg twice daily Heart failure with mildly reduced ejection fraction (HFmrEF, 41-49%) (HCC) EF 40-45.  NYHA I.  Volume status stable. -Continue carvedilol 6.25 mg twice daily -Start losartan 25 mg daily -Arrange CCTA to rule out ischemic cardiomyopathy -BMET 2 weeks -Follow-up 6 to 8 weeks -If non-ischemic cardiomyopathy, consider CMR SVT (supraventricular tachycardia) (HCC) Asymptomatic.  Continue carvedilol 6.25 mg twice daily. Wide-complex tachycardia This was likely A-fib with aberrancy.  Arrange CCTA as noted. Nonrheumatic aortic valve stenosis Mild to moderate aortic stenosis by echocardiogram.  Arrange follow-up echo in 1 year.      Dispo:  Return in about 8 weeks (around 07/23/2023) for Routine Follow Up, w/ Dr. Izora Ribas, or Tereso Newcomer, PA-C.  Signed, Tereso Newcomer, PA-C

## 2023-05-28 ENCOUNTER — Ambulatory Visit: Payer: Medicare Other | Attending: Physician Assistant | Admitting: Physician Assistant

## 2023-05-28 ENCOUNTER — Encounter: Payer: Self-pay | Admitting: Physician Assistant

## 2023-05-28 ENCOUNTER — Other Ambulatory Visit: Payer: Self-pay | Admitting: *Deleted

## 2023-05-28 VITALS — BP 110/70 | HR 86 | Ht 69.0 in | Wt 203.6 lb

## 2023-05-28 DIAGNOSIS — I480 Paroxysmal atrial fibrillation: Secondary | ICD-10-CM

## 2023-05-28 DIAGNOSIS — I350 Nonrheumatic aortic (valve) stenosis: Secondary | ICD-10-CM | POA: Diagnosis not present

## 2023-05-28 DIAGNOSIS — R000 Tachycardia, unspecified: Secondary | ICD-10-CM

## 2023-05-28 DIAGNOSIS — I5020 Unspecified systolic (congestive) heart failure: Secondary | ICD-10-CM

## 2023-05-28 DIAGNOSIS — I471 Supraventricular tachycardia, unspecified: Secondary | ICD-10-CM

## 2023-05-28 MED ORDER — APIXABAN 5 MG PO TABS: 5.0000 mg | ORAL_TABLET | Freq: Two times a day (BID) | ORAL | 3 refills | Status: DC

## 2023-05-28 MED ORDER — LOSARTAN POTASSIUM 25 MG PO TABS: 25.0000 mg | ORAL_TABLET | Freq: Every day | ORAL | 3 refills | Status: DC

## 2023-05-28 MED ORDER — METOPROLOL TARTRATE 100 MG PO TABS
ORAL_TABLET | ORAL | 0 refills | Status: DC
Start: 2023-05-28 — End: 2023-07-09

## 2023-05-28 MED ORDER — PANTOPRAZOLE SODIUM 40 MG PO TBEC: 40.0000 mg | DELAYED_RELEASE_TABLET | Freq: Every day | ORAL | 3 refills | Status: DC

## 2023-05-28 NOTE — Assessment & Plan Note (Signed)
Asymptomatic.  Continue carvedilol 6.25 mg twice daily.

## 2023-05-28 NOTE — Assessment & Plan Note (Signed)
This was likely A-fib with aberrancy.  Arrange CCTA as noted.

## 2023-05-28 NOTE — Assessment & Plan Note (Addendum)
EF 40-45.  NYHA I.  Volume status stable. -Continue carvedilol 6.25 mg twice daily -Start losartan 25 mg daily -Arrange CCTA to rule out ischemic cardiomyopathy -BMET 2 weeks -Follow-up 6 to 8 weeks -If non-ischemic cardiomyopathy, consider CMR

## 2023-05-28 NOTE — Assessment & Plan Note (Signed)
CHADS2-VASc at least 2. Vision changes in the past are not clearly amaurosis fugax. But he does need anticoagulation. We discussed the importance of this.  He takes meloxicam on a regular basis and cannot function without it.  We discussed the increased risk of GI bleeding with this combination.  I have offered him a PPI to help with GI protection.  We discussed the importance of monitoring for bleeding. -Restart Eliquis 5 mg twice daily -Continue carvedilol 6.25 mg twice daily

## 2023-05-28 NOTE — Patient Instructions (Addendum)
Medication Instructions:  Your physician has recommended you make the following change in your medication:   START Losartan 25 mg taking 1 daily  START Eliquis 5 mg taking 1 twice a day  START Protonix 40  mg taking 1 daily  *If you need a refill on your cardiac medications before your next appointment, please call your pharmacy*   Lab Work: 2 WEEKS, GO TO A NEAR [facility] FOR:  BMET  If you have labs (blood work) drawn today and your tests are completely normal, you will receive your results only by: MyChart Message (if you have MyChart) OR A paper copy in the mail If you have any lab test that is abnormal or we need to change your treatment, we will call you to review the results.   Testing/Procedures: Your physician has requested that you have cardiac CT. Cardiac computed tomography (CT) is a painless test that uses an x-ray machine to take clear, detailed pictures of your heart. For further information please visit [email or web address] Please follow instruction sheet BELOW:    Your cardiac CT will be scheduled at one of the below locations:   [facility] [address] [place], [place] [address] [phone]  OR  [facility] [address] [place], [place] [address] [phone]  OR   [facility] [address] [place], [place] [address] [phone]  OR   [facility] [address] [place], [place] [address] [phone]  If scheduled at [facility], please arrive at the [facility] and Children's Entrance (Entrance C2) of [facility] 30 minutes prior to test start time. You can use the FREE valet parking offered at entrance C (encouraged to control the heart rate for the test)  Proceed to the [facility] Radiology Department (first floor) to check-in and test prep.  All radiology patients and guests should use entrance C2 at [facility],  accessed from [facility], even though the hospital's physical address listed is [address].    If scheduled at [facility] or [facility], please arrive 15 mins early for check-in and test prep.  There is spacious parking and easy access to the radiology department from the [facility] V[facility] entrance. Please enter here and check-in with the desk attendant.   Please follow these instructions carefully (unless otherwise directed):  An IV will be required for this test and Nitroglycerin will be given.  Hold all erectile dysfunction medications at least 3 days (72 hrs) prior to test. (Ie viagra, cialis, sildenafil, tadalafil, etc)   On the Night Before the Test: Be sure to Drink plenty of water. Do not consume any caffeinated/decaffeinated beverages or chocolate 12 hours prior to your test. Do not take any antihistamines 12 hours prior to your test.    On the Day of the Test: Drink plenty of water until 1 hour prior to the test. Do not eat any food 1 hour prior to test. You may take your regular medications prior to the test.  Take metoprolol (Lopressor) 100 MG two hours prior to test. If you take Furosemide/Hydrochlorothiazide/Spironolactone/Chlorthalidone, please HOLD on the morning of the test. Patients who wear a continuous glucose monitor MUST remove the device prior to scanning. FEMALES- please wear underwire-free bra if available, avoid dresses & tight clothing  After the Test: Drink plenty of water. After receiving IV contrast, you may experience a mild flushed feeling.  This is normal. On occasion, you may experience a mild rash up to 24 hours after the test. This is not dangerous. If this occurs, you can take Benadryl 25 mg and increase your fluid intake. If you experience trouble breathing, this can be serious. If it is severe call 911 IMMEDIATELY. If it is mild, please call our office.  We will  call to schedule your test 2-4 weeks out understanding that some insurance companies will need an authorization prior to the service being performed.   For more information and frequently asked questions, please visit our website : [email or web address]  For non-scheduling related questions, please contact the cardiac imaging nurse navigator should you have any questions/concerns: Cardiac Imaging Nurse Navigators Direct Office Dial: [phone]   For scheduling needs, including cancellations and rescheduling, please call [place], [phone].      Follow-Up: At [facility], you and your health needs are our priority.  As part of our continuing mission to provide you with exceptional heart care, we have created designated Provider Care Teams.  These Care Teams include your primary Cardiologist (physician) and Advanced Practice Providers (APPs -  Physician Assistants and Nurse Practitioners) who all work together to provide you with the care you need, when you need it.  We recommend signing up for the patient portal called "MyChart".  Sign up information is provided on this After Visit Summary.  MyChart is used to connect with patients for Virtual Visits (Telemedicine).  Patients are able to view lab/test results, encounter notes, upcoming appointments, etc.  Non-urgent messages can be sent to your provider as well.   To learn more about what you can do with MyChart, go to [email or web address].    Your next appointment:   6-8 week(s)  Provider:   Christell Constant, MD  or Tereso Newcomer, PA-C         Other Instructions   1st Floor: - Lobby - Registration  - Pharmacy  - Lab - Cafe  2nd Floor: - PV Lab - Diagnostic Testing (echo, CT, nuclear med)  3rd Floor: - Vacant  4th Floor: - TCTS (cardiothoracic surgery) - AFib Clinic - Structural Heart Clinic - Vascular Surgery  - Vascular Ultrasound  5th Floor: - HeartCare Cardiology (general and  EP) - Clinical Pharmacy for coumadin, hypertension, lipid, weight-loss medications, and med management appointments    Valet parking services will be available as well.

## 2023-05-28 NOTE — Assessment & Plan Note (Signed)
Mild to moderate aortic stenosis by echocardiogram.  Arrange follow-up echo in 1 year.

## 2023-06-12 LAB — BASIC METABOLIC PANEL
BUN/Creatinine Ratio: 31 — ABNORMAL HIGH (ref 10–24)
BUN: 26 mg/dL (ref 8–27)
CO2: 26 mmol/L (ref 20–29)
Calcium: 9 mg/dL (ref 8.6–10.2)
Chloride: 102 mmol/L (ref 96–106)
Creatinine, Ser: 0.84 mg/dL (ref 0.76–1.27)
Glucose: 85 mg/dL (ref 70–99)
Potassium: 4.3 mmol/L (ref 3.5–5.2)
Sodium: 141 mmol/L (ref 134–144)
eGFR: 96 mL/min/{1.73_m2} (ref >59)

## 2023-06-15 ENCOUNTER — Telehealth (HOSPITAL_COMMUNITY): Payer: Self-pay | Admitting: *Deleted

## 2023-06-15 NOTE — Telephone Encounter (Signed)
Reaching out to patient to offer assistance regarding upcoming cardiac imaging study; pt verbalizes understanding of appt date/time, parking situation and where to check in, pre-test NPO status and medications ordered, and verified current allergies; name and call back number provided for further questions should they arise  Larey Brick RN Navigator Cardiac Imaging [facility] [phone] office [phone] cell  Patient to hold his daily carvedilol and take 100mg  metoprolol tartrate two hours prior to his cardiac CT scan.  He is aware to arrive at 8 AM.

## 2023-06-16 ENCOUNTER — Ambulatory Visit (HOSPITAL_COMMUNITY)
Admission: RE | Admit: 2023-06-16 | Discharge: 2023-06-16 | Disposition: A | Discharge status: Home / Self Care | Payer: Medicare Other | Source: Ambulatory Visit | Attending: Physician Assistant | Admitting: Physician Assistant

## 2023-06-16 DIAGNOSIS — I471 Supraventricular tachycardia, unspecified: Secondary | ICD-10-CM | POA: Insufficient documentation

## 2023-06-16 DIAGNOSIS — I5020 Unspecified systolic (congestive) heart failure: Secondary | ICD-10-CM | POA: Insufficient documentation

## 2023-06-16 DIAGNOSIS — R000 Tachycardia, unspecified: Secondary | ICD-10-CM | POA: Diagnosis present

## 2023-06-16 DIAGNOSIS — I480 Paroxysmal atrial fibrillation: Secondary | ICD-10-CM | POA: Insufficient documentation

## 2023-06-16 DIAGNOSIS — I350 Nonrheumatic aortic (valve) stenosis: Secondary | ICD-10-CM | POA: Diagnosis present

## 2023-06-16 MED ORDER — METOPROLOL TARTRATE 5 MG/5ML IV SOLN: 5.0000 mg | Freq: Once | INTRAVENOUS | Status: DC

## 2023-06-16 MED ORDER — METOPROLOL TARTRATE 5 MG/5ML IV SOLN
INTRAVENOUS | Status: AC
  Filled 2023-06-16: qty 5

## 2023-06-16 MED ORDER — DILTIAZEM HCL 25 MG/5ML IV SOLN
INTRAVENOUS | Status: AC
  Filled 2023-06-16: qty 5

## 2023-06-16 MED ORDER — IOHEXOL 350 MG/ML SOLN
100.0000 mL | Freq: Once | INTRAVENOUS | Status: AC | PRN
  Administered 2023-06-16: 100 mL via INTRAVENOUS

## 2023-06-16 MED ORDER — NITROGLYCERIN 0.4 MG SL SUBL
0.8000 mg | SUBLINGUAL_TABLET | Freq: Once | SUBLINGUAL | Status: AC
  Administered 2023-06-16: 0.8 mg via SUBLINGUAL

## 2023-06-16 MED ORDER — NITROGLYCERIN 0.4 MG SL SUBL
SUBLINGUAL_TABLET | SUBLINGUAL | Status: AC
  Filled 2023-06-16: qty 2

## 2023-06-16 MED ORDER — DILTIAZEM HCL 25 MG/5ML IV SOLN
10.0000 mg | INTRAVENOUS | Status: DC | PRN
  Administered 2023-06-16: 5 mg via INTRAVENOUS

## 2023-06-16 MED ORDER — METOPROLOL TARTRATE 5 MG/5ML IV SOLN
10.0000 mg | Freq: Once | INTRAVENOUS | Status: AC | PRN
  Administered 2023-06-16: 5 mg via INTRAVENOUS

## 2023-06-17 ENCOUNTER — Encounter: Payer: Self-pay | Admitting: Physician Assistant

## 2023-06-17 DIAGNOSIS — I2510 Atherosclerotic heart disease of native coronary artery without angina pectoris: Secondary | ICD-10-CM | POA: Insufficient documentation

## 2023-06-17 HISTORY — DX: Atherosclerotic heart disease of native coronary artery without angina pectoris: I25.10

## 2023-06-18 ENCOUNTER — Other Ambulatory Visit: Payer: Self-pay | Admitting: *Deleted

## 2023-06-18 ENCOUNTER — Telehealth: Payer: Self-pay | Admitting: *Deleted

## 2023-06-18 DIAGNOSIS — I5020 Unspecified systolic (congestive) heart failure: Secondary | ICD-10-CM

## 2023-06-18 DIAGNOSIS — I25110 Atherosclerotic heart disease of native coronary artery with unstable angina pectoris: Secondary | ICD-10-CM

## 2023-06-18 DIAGNOSIS — I350 Nonrheumatic aortic (valve) stenosis: Secondary | ICD-10-CM

## 2023-06-18 MED ORDER — ATORVASTATIN CALCIUM 20 MG PO TABS: 20.0000 mg | ORAL_TABLET | Freq: Every day | ORAL | 3 refills | Status: DC

## 2023-06-18 NOTE — Telephone Encounter (Signed)
-----   Message from Tereso Newcomer sent at 06/17/2023 10:17 AM EST ----- Results sent to Dalene Seltzer Wyche via MyChart. See MyChart comments below. A copy has been sent to his PCP as FYI.  PLAN:  -Start Atorvastatin 20 mg once daily  -Lipids, ALT in 3 mos  Mr. Standley  Your CT shows some plaque in your coronary (heart) arteries. But it is not significant enough at this time to cause symptoms and it is not the cause of your reduced pump function (ejection fraction). This is good news! But we do need to make sure we are managing your risk factors aggressively so that these nonobstructive plaques do not be obstructive or dangerous in the future. We should start you on a cholesterol medication to drive your LDL or "bad" cholesterol lower. I would like to start you on Atorvastatin 20 mg once daily and recheck your cholesterol in 3 months. Tereso Newcomer, PA-C

## 2023-06-25 ENCOUNTER — Telehealth: Payer: Self-pay | Admitting: Internal Medicine

## 2023-06-25 DIAGNOSIS — I480 Paroxysmal atrial fibrillation: Secondary | ICD-10-CM

## 2023-06-25 MED ORDER — APIXABAN 5 MG PO TABS: 5.0000 mg | ORAL_TABLET | Freq: Two times a day (BID) | ORAL | 5 refills | Status: AC

## 2023-06-25 NOTE — Telephone Encounter (Signed)
*  STAT* If patient is at the pharmacy, call can be transferred to refill team.   1. Which medications need to be refilled? (please list name of each medication and dose if known)  apixaban (ELIQUIS) 5 MG TABS tablet  2. Which pharmacy/location (including street and city if local pharmacy) is medication to be sent to? [id number] - [place],  - [address]  3. Do they need a 30 day or 90 day supply?   30 day supply + refills

## 2023-06-25 NOTE — Telephone Encounter (Signed)
 Prescription refill request for Eliquis received. Indication: Afib  Last office visit: 05/28/23 Alben Spittle)  Scr: 0.84 (06/11/23)  Age: 68 Weight: 92.4kg  Appropriate dose. Refill sent.

## 2023-06-28 ENCOUNTER — Ambulatory Visit: Payer: Medicare Other | Admitting: Internal Medicine

## 2023-07-07 LAB — LIPID PANEL
Chol/HDL Ratio: 4.1 ratio (ref 0.0–5.0)
Cholesterol, Total: 115 mg/dL (ref 100–199)
HDL: 28 mg/dL — ABNORMAL LOW (ref >39)
LDL Chol Calc (NIH): 74 mg/dL (ref 0–99)
Triglycerides: 60 mg/dL (ref 0–149)
VLDL Cholesterol Cal: 13 mg/dL (ref 5–40)

## 2023-07-07 LAB — ALT: ALT: 29 IU/L (ref 0–44)

## 2023-07-08 NOTE — Progress Notes (Signed)
 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  Coronary artery disease  CCTA 06/16/23: CAC Score=415 (78th percentile); TPV 386 (calc plaque 135, noncalc plaque 251) - TPV Severe; Nonobstructive CAD - pRCA 0-24, mRCA 0-24; LM 0-24; pLAD 0-24, oD1 25-49  Supraventricular Tachycardia Wide-complex tachycardia Monitor 04/2023: Frequent SVT and atrial fibrillation (longest 17 seconds), wide-complex tachycardia (22 beats), frequent PACs, rare PVCs Amaurosis fugax Carotid US 05/24/23: Mild thickening, no stenosis Low testosterone Hyperlipidemia LP(a) 22.5          Discussed the use of AI scribe software for clinical note transcription with the patient, who gave verbal consent to proceed. History of Present Illness Brad COCOZZA is a 68 y.o. male who returns for follow up on AFib, CHF, CAD, AS. He was last seen in 05/2023. I started him on Eliquis given stroke risk. Note, he takes Meloxicam on a regular basis and we discussed increased risk of bleeding. CCTA was arranged to evaluate his cardiomyopathy. This demonstrated mild to mod, mixed calcific and non-calcific nonobstructive CAD. I started him on Atorvastatin 20 mg.   He is here alone.  No chest symptoms, pain, pressure, or tightness are present, and he denies shortness of breath.  He experienced minor bleeding at the start of urination when he first began Eliquis, which has since resolved. He associates this with a past prostate issue for which he  underwent TURP surgery over a year ago. He continues to see a urologist every six months. He is scheduled for a colonoscopy next Friday and was set for hernia surgery, which was postponed pending cardiac evaluation.  ROS-See HPI    Studies Reviewed:       Results LABS Creatinine: 0.84 mg/dL (40/98/1191) Potassium: 4.3 mEq/L (06/11/2023)   Risk Assessment/Calculations:    CHA2DS2-VASc Score = 4   This indicates a 4.8% annual risk of stroke. The patient's score is based upon: CHF History: 1 HTN History: 0 Diabetes History: 0 Stroke History: 2 Vascular Disease History: 0 Age Score: 1 Gender Score: 0            Physical Exam:   VS:  BP 132/68   Pulse 88   Ht 5\' 9"  (1.753 m)   Wt 202 lb 9.6 oz (91.9 kg)   SpO2 97%   BMI 29.92 kg/m    Wt Readings from Last 3 Encounters:  07/09/23 202 lb 9.6 oz (91.9 kg)  05/28/23 203 lb 9.6 oz (92.4 kg)  04/20/23 199 lb 3.2 oz (90.4 kg)    Constitutional:      Appearance: Healthy appearance. Not in distress.  Neck:     Vascular: JVD normal.  Pulmonary:     Effort: Pulmonary effort is normal.     Breath sounds: No wheezing. No rales.  Cardiovascular:     Normal rate. Regular rhythm.     Murmurs: There is a grade 2/6 systolic murmur at the URSB.  Edema:    Peripheral edema absent.  Abdominal:  Palpations: Abdomen is soft.        Assessment and Plan:   Assessment & Plan Paroxysmal A-fib (HCC) Maintaining NSR on exam.  He does note a mild amount of hematuria when he first started on Eliquis. -Continue Eliquis 5mg  twice daily. -If hematuria recurs, he should contact urologist for earlier follow-up. Heart failure with mildly reduced ejection fraction (HFmrEF, 41-49%) (HCC) Non-ischemic Cardiomyopathy.  NYHA I.  Volume status stable.  We discussed the four pillars of heart failure management.   -Continue Carvedilol 6.25mg  twice daily and Losartan 25mg  daily. -Start Jardiance 10mg  daily. -Consider starting Spironolactone in  6-8 weeks. -Plan to recheck echocardiogram versus MRI once on maximum medical therapy for cardiomyopathy. -Follow-up 6 to 8 weeks Coronary artery disease involving native coronary artery of native heart without angina pectoris Mild to moderate mixed calcific and noncalcific nonobstructive coronary disease on recent CCTA.  He is doing well without chest pain to suggest angina.  No need for antiplatelet therapy as patient is on Eliquis. -Continue Lipitor 20mg  daily. Nonrheumatic aortic valve stenosis Mild to moderate aortic stenosis on echocardiogram January 2025.  He will have follow-up imaging to reassess ejection fraction after on maximal GDMT.  He will then need surveillance echocardiograms every 6 to 12 months. Preoperative cardiovascular examination He notes that he is scheduled for colonoscopy next week.  His perioperative risk of a major cardiac event is elevated at 6.6% according to the Revised Cardiac Risk Index (RCRI).  However, His functional capacity is excellent at 4.31 METs according to the Duke Activity Status Index (DASI). Recommendations: According to ACC/AHA guidelines, no further cardiovascular testing needed.  The patient may proceed to surgery at acceptable risk.   Antiplatelet and/or Anticoagulation Recommendations: If needed, Eliquis (Apixaban) can be held for 2 days prior to surgery.  Please resume post op when felt to be safe.   Pure hypercholesterolemia -Continue Lipitor 20mg  daily. -Check CMET, lipid panel in 4 weeks.       Dispo:  Return in about 6 weeks (around 08/20/2023) for Routine Follow Up, w/ Dr. Izora Ribas, or Tereso Newcomer, PA-C.  Signed, Tereso Newcomer, PA-C

## 2023-07-09 ENCOUNTER — Other Ambulatory Visit: Payer: Self-pay | Admitting: *Deleted

## 2023-07-09 ENCOUNTER — Ambulatory Visit: Payer: Medicare Other | Attending: Physician Assistant | Admitting: Physician Assistant

## 2023-07-09 ENCOUNTER — Encounter: Payer: Self-pay | Admitting: Physician Assistant

## 2023-07-09 VITALS — BP 132/68 | HR 88 | Ht 69.0 in | Wt 202.6 lb

## 2023-07-09 DIAGNOSIS — E7800 Pure hypercholesterolemia, unspecified: Secondary | ICD-10-CM

## 2023-07-09 DIAGNOSIS — I2510 Atherosclerotic heart disease of native coronary artery without angina pectoris: Secondary | ICD-10-CM

## 2023-07-09 DIAGNOSIS — I480 Paroxysmal atrial fibrillation: Secondary | ICD-10-CM | POA: Diagnosis not present

## 2023-07-09 DIAGNOSIS — I350 Nonrheumatic aortic (valve) stenosis: Secondary | ICD-10-CM

## 2023-07-09 DIAGNOSIS — Z01810 Encounter for preprocedural cardiovascular examination: Secondary | ICD-10-CM

## 2023-07-09 DIAGNOSIS — I5020 Unspecified systolic (congestive) heart failure: Secondary | ICD-10-CM

## 2023-07-09 MED ORDER — EMPAGLIFLOZIN 10 MG PO TABS: 10.0000 mg | ORAL_TABLET | Freq: Every day | ORAL | Status: DC

## 2023-07-09 MED ORDER — EMPAGLIFLOZIN 10 MG PO TABS: 10.0000 mg | ORAL_TABLET | Freq: Every day | ORAL | 3 refills | Status: DC

## 2023-07-09 NOTE — Assessment & Plan Note (Signed)
 Maintaining NSR on exam.  He does note a mild amount of hematuria when he first started on Eliquis. -Continue Eliquis 5mg  twice daily. -If hematuria recurs, he should contact urologist for earlier follow-up.

## 2023-07-09 NOTE — Assessment & Plan Note (Signed)
 Mild to moderate aortic stenosis on echocardiogram January 2025.  He will have follow-up imaging to reassess ejection fraction after on maximal GDMT.  He will then need surveillance echocardiograms every 6 to 12 months.

## 2023-07-09 NOTE — Assessment & Plan Note (Signed)
 Mild to moderate mixed calcific and noncalcific nonobstructive coronary disease on recent CCTA.  He is doing well without chest pain to suggest angina.  No need for antiplatelet therapy as patient is on Eliquis. -Continue Lipitor 20mg  daily.

## 2023-07-09 NOTE — Patient Instructions (Addendum)
 Medication Instructions:  Your physician has recommended you make the following change in your medication:   START Jardiance 10 mg taking 1 daily  *If you need a refill on your cardiac medications before your next appointment, please call your pharmacy*   Lab Work: IN 1 MO GO TO A [facility] NEAR YOU, FASTING, FOR:  LIPID & ALT  If you have labs (blood work) drawn today and your tests are completely normal, you will receive your results only by: MyChart Message (if you have MyChart) OR A paper copy in the mail If you have any lab test that is abnormal or we need to change your treatment, we will call you to review the results.   Testing/Procedures: None ordered   Follow-Up: At [facility], you and your health needs are our priority.  As part of our continuing mission to provide you with exceptional heart care, we have created designated Provider Care Teams.  These Care Teams include your primary Cardiologist (physician) and Advanced Practice Providers (APPs -  Physician Assistants and Nurse Practitioners) who all work together to provide you with the care you need, when you need it.  We recommend signing up for the patient portal called "MyChart".  Sign up information is provided on this After Visit Summary.  MyChart is used to connect with patients for Virtual Visits (Telemedicine).  Patients are able to view lab/test results, encounter notes, upcoming appointments, etc.  Non-urgent messages can be sent to your provider as well.   To learn more about what you can do with MyChart, go to [email or web address].    Your next appointment:   7 week(s)  Provider:   Christell Constant, MD     Other Instructions Ok to have your colonoscopy.  If needed, you can hold the Eliquis up to 2 days before    1st Floor: - Lobby - Registration  - Pharmacy  - Lab - Cafe  2nd Floor: - PV Lab - Diagnostic Testing (echo, CT, nuclear med)  3rd Floor: - Vacant  4th Floor: -  TCTS (cardiothoracic surgery) - AFib Clinic - Structural Heart Clinic - Vascular Surgery  - Vascular Ultrasound  5th Floor: - HeartCare Cardiology (general and EP) - Clinical Pharmacy for coumadin, hypertension, lipid, weight-loss medications, and med management appointments    Valet parking services will be available as well.

## 2023-07-09 NOTE — Assessment & Plan Note (Signed)
 Non-ischemic Cardiomyopathy.  NYHA I.  Volume status stable.  We discussed the four pillars of heart failure management.   -Continue Carvedilol 6.25mg  twice daily and Losartan 25mg  daily. -Start Jardiance 10mg  daily. -Consider starting Spironolactone in 6-8 weeks. -Plan to recheck echocardiogram versus MRI once on maximum medical therapy for cardiomyopathy. -Follow-up 6 to 8 weeks

## 2023-07-18 ENCOUNTER — Encounter: Payer: Self-pay | Admitting: Internal Medicine

## 2023-08-19 LAB — LIPID PANEL
Chol/HDL Ratio: 4 ratio (ref 0.0–5.0)
Cholesterol, Total: 107 mg/dL (ref 100–199)
HDL: 27 mg/dL — ABNORMAL LOW (ref >39)
LDL Chol Calc (NIH): 67 mg/dL (ref 0–99)
Triglycerides: 58 mg/dL (ref 0–149)
VLDL Cholesterol Cal: 13 mg/dL (ref 5–40)

## 2023-08-19 LAB — ALT: ALT: 30 IU/L (ref 0–44)

## 2023-08-24 ENCOUNTER — Ambulatory Visit: Attending: Internal Medicine | Admitting: Internal Medicine

## 2023-08-24 VITALS — BP 124/60 | HR 85 | Ht 69.0 in | Wt 201.0 lb

## 2023-08-24 DIAGNOSIS — Z79899 Other long term (current) drug therapy: Secondary | ICD-10-CM

## 2023-08-24 DIAGNOSIS — I5020 Unspecified systolic (congestive) heart failure: Secondary | ICD-10-CM

## 2023-08-24 DIAGNOSIS — I2510 Atherosclerotic heart disease of native coronary artery without angina pectoris: Secondary | ICD-10-CM | POA: Diagnosis not present

## 2023-08-24 DIAGNOSIS — I480 Paroxysmal atrial fibrillation: Secondary | ICD-10-CM

## 2023-08-24 MED ORDER — EMPAGLIFLOZIN 10 MG PO TABS: 10.0000 mg | ORAL_TABLET | Freq: Every day | ORAL | 3 refills | Status: DC

## 2023-08-24 NOTE — Patient Instructions (Signed)
 Medication Instructions:  Your physician has recommended you make the following change in your medication:  START: Jardiance  10 mg by mouth daily before breakfast  *If you need a refill on your cardiac medications before your next appointment, please call your pharmacy*  Lab Work: IN 2 WEEKS at any [facility]: CBC, BMP  If you have labs (blood work) drawn today and your tests are completely normal, you will receive your results only by: [employer] (if you have MyChart) OR A paper copy in the mail If you have any lab test that is abnormal or we need to change your treatment, we will call you to review the results.  Testing/Procedures: AUG 2025: Your physician has requested that you have an echocardiogram. Echocardiography is a painless test that uses sound waves to create images of your heart. It provides your doctor with information about the size and shape of your heart and how well your heart's chambers and valves are working. This procedure takes approximately one hour. There are no restrictions for this procedure. Please do NOT wear cologne, perfume, aftershave, or lotions (deodorant is allowed). Please arrive 15 minutes prior to your appointment time.  Please note: We ask at that you not bring children with you during ultrasound (echo/ vascular) testing. Due to room size and safety concerns, children are not allowed in the ultrasound rooms during exams. Our front office staff cannot provide observation of children in our lobby area while testing is being conducted. An adult accompanying a patient to their appointment will only be allowed in the ultrasound room at the discretion of the ultrasound technician under special circumstances. We apologize for any inconvenience.   Follow-Up: At [facility], you and your health needs are our priority.  As part of our continuing mission to provide you with exceptional heart care, our providers are all part of one team.  This team  includes your primary Cardiologist (physician) and Advanced Practice Providers or APPs (Physician Assistants and Nurse Practitioners) who all work together to provide you with the care you need, when you need it.  Your next appointment:   5 month(s)  Provider:   Jann Melody, MD        Other Instructions       1st Floor: - Lobby - Registration  - Pharmacy  - Lab - Cafe  2nd Floor: - PV Lab - Diagnostic Testing (echo, CT, nuclear med)  3rd Floor: - Vacant  4th Floor: - TCTS (cardiothoracic surgery) - AFib Clinic - Structural Heart Clinic - Vascular Surgery  - Vascular Ultrasound  5th Floor: - HeartCare Cardiology (general and EP) - Clinical Pharmacy for coumadin, hypertension, lipid, weight-loss medications, and med management appointments    Valet parking services will be available as well.

## 2023-08-24 NOTE — Progress Notes (Signed)
 Cardiology Office Note:  .    Date:  08/24/2023  ID:  Brad Davis, DOB August 18, 1955, MRN [id number] PCP: Margarete Sharps, MD  [facility] Providers Cardiologist:  Jann Melody, MD     CC: AF f/u.  History of Present Illness: .    Brad Davis is a 68 y.o. male with a history of AF presents for CAC and HF f/u.  Brad Davis is a 68 year old male with paroxysmal atrial fibrillation and heart failure with reduced ejection fraction who presents for evaluation of hematuria and medication management.  He has a history of paroxysmal atrial fibrillation, which has been largely asymptomatic and was initially discovered during a workup for other cardiac issues. Despite the arrhythmia, he feels good and continues to work out daily, although he notes he cannot train as heavily as he used to due to age and injuries. He is on a blood thinner due to the increased risk of stroke associated with his heart condition.  He was diagnosed with heart failure with reduced ejection fraction, with an EF~45%. He is currently on carvedilol  and losartan  for management. He feels tired at times, which he attributes to his workout routine, but states it is rare. He has not experienced significant issues with high blood pressure.  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 He is on atorvastatin  for cholesterol management, with a recent cholesterol level of 67 mg/dL. He maintains a very clean diet and exercises regularly, which he believes has helped manage his condition.  Recently, he has experienced hematuria, noticing a small amount of blood at the start of urination. He has not reported any significant bleeding issues otherwise.  He was previously on Jardiance  (ordered empagliflozin ) but stopped due to feeling overwhelmed by the number of medications. He has a history of using testosterone and growth hormone, which he believes helps maintain his  energy levels, although he acknowledges the potential risks associated with these substances.   Discussed the use of AI scribe software for clinical note transcription with the patient, who gave verbal consent to proceed.  Relevant histories: .  Social- his mother is my neighbor, he owns [employer] ROS: As per HPI.   Physical Exam:    VS:  BP 124/60 (BP Location: Left Arm)   Pulse 85   Ht 5\' 9"  (1.753 m)   Wt 91.2 kg   SpO2 93%   BMI 29.68 kg/m    Wt Readings from Last 3 Encounters:  08/24/23 91.2 kg  07/09/23 91.9 kg  05/28/23 92.4 kg    Gen: No distress, muscular   Neck: No JVD Ears: Bilateral Samuel Crock Sign Cardiac: No Rubs or Gallops, systolic murmur, iRRR +2 radial pulses Respiratory: Clear to auscultation bilaterally, normal effort, normal  respiratory rate GI: Soft, nontender, non-distended  MS: No edema;  moves all extremities Integument: Skin feels warm Neuro:  At time of evaluation, alert and oriented to person/place/time/situation  Psych: Normal affect, patient feels well   ASSESSMENT AND PLAN: .    Heart failure with reduced ejection fraction Heart failure with mildly reduced ejection fraction, EF 45%. Largely asymptomatic. Previously on carvedilol  and losartan . Jardiance  (empagliflozin ) discussed for mortality benefit, potential heart function improvement, decreased kidney disease risk, and improved borderline elevated blood sugars. He expressed concern about polypharmacy. - Initiate Jardiance  (empagliflozin ) 10 mg once daily. - Check blood count in two weeks to monitor for hematuria and assess tolerance to Jardiance . - Order  echocardiogram in four months to assess heart function.  Paroxysmal atrial fibrillation Paroxysmal atrial fibrillation, largely asymptomatic. Anticoagulation necessary due to increased stroke risk from age, heart function, and coronary artery disease. Minimal hematuria reported, possibly related to anticoagulation. Discussed  potential for left atrial appendage occlusion if significant bleeding issues arise. - Continue anticoagulation therapy. - Monitor for bleeding issues and fatigue. - Consider left atrial appendage occlusion if significant bleeding issues occur. - low threshold to discuss ablation; we discuss TRT and its risk of arrythmias  Supraventricular tachycardia Supraventricular tachycardia noted on heart monitor. Asymptomatic. Discussed potential treatment options including antiarrhythmics and ablation, but current plan is to monitor due to lack of symptoms. Increasing carvedilol  is not preferred due to potential fatigue.  Premature atrial contractions Frequent premature atrial contractions noted on heart monitor. Asymptomatic. Discussed potential treatment options, but current plan is to monitor due to lack of symptoms. Increasing carvedilol  is not preferred due to potential fatigue.  Nonobstructive coronary artery disease Mild nonobstructive coronary artery disease identified. No need for stenting or bypass. Emphasis on aggressive cholesterol management due to genetic predisposition. Current cholesterol level is 67 mg/dL, with a goal to reduce to below 55 mg/dL. He maintains a clean diet and regular exercise regimen. - Continue current cholesterol management plan. - Monitor cholesterol levels and adjust treatment as needed. 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 No current symptoms or sleep apnea reported. Monitoring planned. - echo is pending  Hematuria Minimal hematuria reported, possibly related to anticoagulation. Monitoring planned to assess if it becomes significant. Discussed potential for left atrial appendage occlusion if significant bleeding issues arise. - Monitor for any increase in hematuria. - Check blood count in two weeks to assess for any significant changes.    Gloriann Larger, MD  [facility] Cardiologist [facility]  [address] [place], [address] [place], [place] [address] [phone]  10:01 AM

## 2023-09-07 ENCOUNTER — Encounter: Payer: Self-pay | Admitting: Internal Medicine

## 2023-09-07 LAB — CBC
Hematocrit: 51.6 % — ABNORMAL HIGH (ref 37.5–51.0)
Hemoglobin: 16.9 g/dL (ref 13.0–17.7)
MCH: 27.9 pg (ref 26.6–33.0)
MCHC: 32.8 g/dL (ref 31.5–35.7)
MCV: 85 fL (ref 79–97)
Platelets: 171 10*3/uL (ref 150–450)
RBC: 6.05 x10E6/uL — ABNORMAL HIGH (ref 4.14–5.80)
RDW: 15.3 % (ref 11.6–15.4)
WBC: 5.5 10*3/uL (ref 3.4–10.8)

## 2023-09-07 LAB — BASIC METABOLIC PANEL WITH GFR
BUN/Creatinine Ratio: 32 — ABNORMAL HIGH (ref 10–24)
BUN: 25 mg/dL (ref 8–27)
CO2: 20 mmol/L (ref 20–29)
Calcium: 8.4 mg/dL — ABNORMAL LOW (ref 8.6–10.2)
Chloride: 108 mmol/L — ABNORMAL HIGH (ref 96–106)
Creatinine, Ser: 0.79 mg/dL (ref 0.76–1.27)
Glucose: 96 mg/dL (ref 70–99)
Potassium: 4 mmol/L (ref 3.5–5.2)
Sodium: 141 mmol/L (ref 134–144)
eGFR: 97 mL/min/{1.73_m2} (ref >59)

## 2023-09-08 ENCOUNTER — Other Ambulatory Visit: Payer: Self-pay | Admitting: Gastroenterology

## 2023-09-17 ENCOUNTER — Encounter (HOSPITAL_COMMUNITY): Payer: Self-pay | Admitting: Gastroenterology

## 2023-09-24 ENCOUNTER — Ambulatory Visit (HOSPITAL_COMMUNITY): Admitting: Anesthesiology

## 2023-09-24 ENCOUNTER — Ambulatory Visit (HOSPITAL_COMMUNITY)
Admission: RE | Admit: 2023-09-24 | Discharge: 2023-09-24 | Disposition: A | Discharge status: Home / Self Care | Attending: Gastroenterology | Admitting: Gastroenterology

## 2023-09-24 ENCOUNTER — Encounter (HOSPITAL_COMMUNITY)
Admission: RE | Disposition: A | Discharge status: Home / Self Care | Payer: Self-pay | Source: Home / Self Care | Attending: Gastroenterology

## 2023-09-24 ENCOUNTER — Other Ambulatory Visit: Payer: Self-pay

## 2023-09-24 ENCOUNTER — Encounter (HOSPITAL_COMMUNITY): Payer: Self-pay | Admitting: Gastroenterology

## 2023-09-24 DIAGNOSIS — Z79899 Other long term (current) drug therapy: Secondary | ICD-10-CM | POA: Insufficient documentation

## 2023-09-24 DIAGNOSIS — K5730 Diverticulosis of large intestine without perforation or abscess without bleeding: Secondary | ICD-10-CM | POA: Insufficient documentation

## 2023-09-24 DIAGNOSIS — D123 Benign neoplasm of transverse colon: Secondary | ICD-10-CM

## 2023-09-24 DIAGNOSIS — D509 Iron deficiency anemia, unspecified: Secondary | ICD-10-CM

## 2023-09-24 DIAGNOSIS — I2510 Atherosclerotic heart disease of native coronary artery without angina pectoris: Secondary | ICD-10-CM | POA: Insufficient documentation

## 2023-09-24 DIAGNOSIS — D120 Benign neoplasm of cecum: Secondary | ICD-10-CM

## 2023-09-24 DIAGNOSIS — Z7901 Long term (current) use of anticoagulants: Secondary | ICD-10-CM | POA: Insufficient documentation

## 2023-09-24 DIAGNOSIS — K3189 Other diseases of stomach and duodenum: Secondary | ICD-10-CM

## 2023-09-24 DIAGNOSIS — I4891 Unspecified atrial fibrillation: Secondary | ICD-10-CM | POA: Insufficient documentation

## 2023-09-24 DIAGNOSIS — Z791 Long term (current) use of non-steroidal anti-inflammatories (NSAID): Secondary | ICD-10-CM | POA: Diagnosis not present

## 2023-09-24 DIAGNOSIS — M1990 Unspecified osteoarthritis, unspecified site: Secondary | ICD-10-CM | POA: Insufficient documentation

## 2023-09-24 HISTORY — PX: BIOPSY OF SKIN SUBCUTANEOUS TISSUE AND/OR MUCOUS MEMBRANE: SHX6741

## 2023-09-24 HISTORY — PX: POLYPECTOMY: SHX149

## 2023-09-24 HISTORY — PX: ESOPHAGOGASTRODUODENOSCOPY: SHX5428

## 2023-09-24 HISTORY — PX: COLONOSCOPY: SHX5424

## 2023-09-24 SURGERY — EGD (ESOPHAGOGASTRODUODENOSCOPY)
Anesthesia: Monitor Anesthesia Care

## 2023-09-24 MED ORDER — PROPOFOL 10 MG/ML IV BOLUS
INTRAVENOUS | Status: AC
  Filled 2023-09-24: qty 20

## 2023-09-24 MED ORDER — SODIUM CHLORIDE 0.9 % IV SOLN: INTRAVENOUS | Status: DC | PRN

## 2023-09-24 MED ORDER — LIDOCAINE 2% (20 MG/ML) 5 ML SYRINGE
INTRAMUSCULAR | Status: DC | PRN
  Administered 2023-09-24: 100 mg via INTRAVENOUS

## 2023-09-24 MED ORDER — PROPOFOL 500 MG/50ML IV EMUL
INTRAVENOUS | Status: AC
  Filled 2023-09-24: qty 250

## 2023-09-24 MED ORDER — PHENYLEPHRINE 80 MCG/ML (10ML) SYRINGE FOR IV PUSH (FOR BLOOD PRESSURE SUPPORT)
PREFILLED_SYRINGE | INTRAVENOUS | Status: DC | PRN
  Administered 2023-09-24: 160 ug via INTRAVENOUS

## 2023-09-24 MED ORDER — PROPOFOL 500 MG/50ML IV EMUL
INTRAVENOUS | Status: DC | PRN
  Administered 2023-09-24: 125 ug/kg/min via INTRAVENOUS

## 2023-09-24 MED ORDER — PROPOFOL 10 MG/ML IV BOLUS
INTRAVENOUS | Status: DC | PRN
  Administered 2023-09-24: 100 mg via INTRAVENOUS

## 2023-09-24 NOTE — Transfer of Care (Signed)
 Immediate Anesthesia Transfer of Care Note  Patient: Brad Davis  Procedure(s) Performed: EGD (ESOPHAGOGASTRODUODENOSCOPY) COLONOSCOPY BIOPSY, SKIN, SUBCUTANEOUS TISSUE, OR MUCOUS MEMBRANE POLYPECTOMY, INTESTINE  Patient Location: Endoscopy Unit  Anesthesia Type:MAC  Level of Consciousness: drowsy and patient cooperative  Airway & Oxygen Therapy: Patient Spontanous Breathing and Patient connected to face mask oxygen  Post-op Assessment: Report given to RN and Post -op Vital signs reviewed and stable  Post vital signs: Reviewed and stable  Last Vitals:  Vitals Value Taken Time  BP 101/67 09/24/23 0815  Temp    Pulse 69 09/24/23 0818  Resp 22 09/24/23 0818  SpO2 100 % 09/24/23 0818  Vitals shown include unfiled device data.  Last Pain:  Vitals:   09/24/23 0717  TempSrc: Temporal  PainSc: 0-No pain      Patients Stated Pain Goal: 0 (09/24/23 0717)  Complications: No notable events documented.

## 2023-09-24 NOTE — Anesthesia Preprocedure Evaluation (Signed)
 Anesthesia Evaluation  Patient identified by MRN, date of birth, ID band Patient awake    Reviewed: Allergy & Precautions, NPO status , Patient's Chart, lab work & pertinent test results  Airway Mallampati: II  TM Distance: >3 FB Neck ROM: Full    Dental   Pulmonary neg pulmonary ROS   breath sounds clear to auscultation       Cardiovascular + CAD  + dysrhythmias Atrial Fibrillation  Rhythm:Regular Rate:Normal     Neuro/Psych negative neurological ROS     GI/Hepatic negative GI ROS, Neg liver ROS,,,  Endo/Other  negative endocrine ROS    Renal/GU negative Renal ROS     Musculoskeletal  (+) Arthritis ,    Abdominal   Peds  Hematology negative hematology ROS (+)   Anesthesia Other Findings   Reproductive/Obstetrics                             Anesthesia Physical Anesthesia Plan  ASA: 2  Anesthesia Plan: MAC   Post-op Pain Management:    Induction:   PONV Risk Score and Plan: 1 and Propofol  infusion  Airway Management Planned: Natural Airway, Nasal Cannula and Simple Face Mask  Additional Equipment:   Intra-op Plan:   Post-operative Plan:   Informed Consent: I have reviewed the patients History and Physical, chart, labs and discussed the procedure including the risks, benefits and alternatives for the proposed anesthesia with the patient or authorized representative who has indicated his/her understanding and acceptance.       Plan Discussed with: CRNA  Anesthesia Plan Comments:        Anesthesia Quick Evaluation

## 2023-09-24 NOTE — H&P (Signed)
 Brad Davis HPI: This 68 year old white male presents to the office to reschedule a colonoscopy; it is scheduled on Sep 10, 2023. He has been diagnosed with atrial fibrillation since he was here last and is now on Eliquis . He has 2 BM's per day with no obvious blood or mucus in the stool. He has good appetite and his weight has been stable. He takes Meloxicam for arthritis and joint pains. He reports he was given Pantoprazole  to off set the Meloxicam but denies any problems with acid reflux. He denies having any complaints of abdominal pain, nausea, vomiting, dysphagia or odynophagia. He denies having a family history of colon cancer, celiac sprue or IBD.  Past Medical History:  Diagnosis Date   CAD (coronary artery disease) 06/17/2023   CCTA 06/16/23: CAC Score=415 (78th percentile); TPV 386 (calc plaque 135, noncalc plaque 251) - TPV Severe; Nonobstructive CAD - pRCA 0-24, mRCA 0-24; LM 0-24; pLAD 0-24, oD1 25-49     Carcinoma (HCC)    right chest small area to be removed jan  2024   DJD (degenerative joint disease)    Heart failure with mildly reduced ejection fraction (HFmrEF, 41-49%) (HCC) 05/27/2023   - Non-ischemic cardiomyopathy   - TTE 05/24/2023: EF 40-45, global HK, GR 1 DD, GLS -14, normal RVSF, mild RAE, trivial MR, mild AI, mild to moderate AS (V-max 250 cm/s, mean gradient 13 mmHg, DI 0.29), RAP 3  - CCTA 06/2023: nonobstructive CAD     History of COVID-19 04/2020   mild symptoms all resolved   Polycythemia    giving blood 02-07-2021 at [facility] for, gives blood q month for    Past Surgical History:  Procedure Laterality Date    right elbow bursa sack repair -triceps strengthening   12/2020   ABSCESS DRAINAGE  05/04/2006   buttock   APPENDECTOMY  05/04/2004   open   CYSTOSCOPY WITH FULGERATION N/A 03/24/2022   Procedure: CYSTOSCOPY, CLOT EVACUATION WITH FULGERATION;  Surgeon: Adelbert Homans, MD;  Location: [facility];  Service: Urology;  Laterality: N/A;    CYSTOSCOPY WITH FULGERATION N/A 04/08/2022   Procedure: CYSTOSCOPY, BILATERAL RETROGRADE PYELOGRAM, CLOT EVACUATION, FULGERATION/ RE-RESECTION;  Surgeon: Andrez Banker, MD;  Location: [facility];  Service: Urology;  Laterality: N/A;   GREAT TOE ARTHRODESIS, INTERPHALANGEAL JOINT     rt 30 yrs ago   KNEE ARTHROSCOPY  1983   rt   left rotator cuff  08/2020   LUMBAR LAMINECTOMY  05/05/1995   MASS EXCISION Right 12/14/2013   Procedure: MINOR EXCISION OF MASS;  Surgeon: Ferd Householder, MD;  Location: [facility];  Service: Orthopedics;  Laterality: Right;   right shoulder cyst removed  2016   SHOULDER ARTHROSCOPY  05/04/1986   left   SHOULDER ARTHROSCOPY WITH ROTATOR CUFF REPAIR AND SUBACROMIAL DECOMPRESSION Left 10/20/2012   Procedure: LEFT SHOULDER ARTHROSCOPY , DISTAL CLAVICULECTOMY, EXTENSIVE DEBRIDEMENT, WITH SUBACROMIAL DECOMPRESSION, DEBRIDE LABRUM ROTATOR CUFF;  Surgeon: Ferd Householder, MD;  Location: [facility];  Service: Orthopedics;  Laterality: Left;   TRANSURETHRAL RESECTION OF PROSTATE N/A 02/12/2022   Procedure: TRANSURETHRAL RESECTION OF THE PROSTATE (TURP);  Surgeon: Andrez Banker, MD;  Location: [facility];  Service: Urology;  Laterality: N/A;  100 MINUTES NEEDED FOR CASE   TRANSURETHRAL RESECTION OF PROSTATE N/A 03/20/2022   Procedure: cystoscopy clot evacuation and fulguration;  Surgeon: Mallie Seal, MD;  Location: [facility];  Service: Urology;  Laterality: N/A;  History reviewed. No pertinent family history.  Social History:  reports that he has never smoked. He has never used smokeless tobacco. He reports that he does not drink alcohol and does not use drugs.  Allergies:  Allergies  Allergen Reactions   Chlorhexidine  Itching    Pt states when used wipes in past caused severe itching    Medications: Scheduled: Continuous:  No results found for this or any previous visit (from the past 24 hours).   No  results found.  ROS:  As stated above in the HPI otherwise negative.  Blood pressure 134/79, temperature 97.7 F (36.5 C), temperature source Temporal, resp. rate 14, height 5\' 9"  (1.753 m), weight 90.7 kg, SpO2 95%.    PE: Gen: NAD, Alert and Oriented HEENT:  [place]/AT, EOMI Neck: Supple, no LAD Lungs: CTA Bilaterally CV: RRR without M/G/R ABD: Soft, NTND, +BS Ext: No C/C/E  Assessment/Plan: 1) IDA - EGD/colonoscopy.  Brad Davis 09/24/2023, 7:21 AM

## 2023-09-24 NOTE — Op Note (Signed)
 [facility] Patient Name: Brad Davis Procedure Date: 09/24/2023 MRN: [id number] Attending MD: Alvis Jourdain , MD, [id number] Date of Birth: Jul 09, 1955 CSN: [id number] Age: 68 Admit Type: Outpatient Procedure:                Colonoscopy Indications:              Iron deficiency anemia Providers:                Alvis Jourdain, MD, Lonzell Robin, RN, Marvelyn Slim, Technician Referring MD:             Alvis Jourdain, MD Medicines:                Propofol  per Anesthesia Complications:            No immediate complications. Estimated Blood Loss:     Estimated blood loss: none. Procedure:                Pre-Anesthesia Assessment:                           - Prior to the procedure, a History and Physical                            was performed, and patient medications and                            allergies were reviewed. The patient's tolerance of                            previous anesthesia was also reviewed. The risks                            and benefits of the procedure and the sedation                            options and risks were discussed with the patient.                            All questions were answered, and informed consent                            was obtained. Prior Anticoagulants: The patient has                            taken Eliquis  (apixaban ), last dose was 2 days                            prior to procedure. ASA Grade Assessment: III - A                            patient with severe systemic disease. After  reviewing the risks and benefits, the patient was                            deemed in satisfactory condition to undergo the                            procedure.                           - Sedation was administered by an anesthesia                            professional. Deep sedation was attained.                           After obtaining informed consent, the colonoscope                             was passed under direct vision. Throughout the                            procedure, the patient's blood pressure, pulse, and                            oxygen saturations were monitored continuously. The                            [id number]-HQ190L ([id number] Olympus colonoscope was                            introduced through the anus and advanced to the the                            cecum, identified by appendiceal orifice and                            ileocecal valve. The colonoscopy was performed                            without difficulty. The patient tolerated the                            procedure well. The quality of the bowel                            preparation was evaluated using the BBPS [facility]                            Bowel Preparation Scale) with scores of: Right                            Colon = 3 (entire mucosa seen well with no residual  staining, small fragments of stool or opaque                            liquid), Transverse Colon = 3 (entire mucosa seen                            well with no residual staining, small fragments of                            stool or opaque liquid) and Left Colon = 3 (entire                            mucosa seen well with no residual staining, small                            fragments of stool or opaque liquid). The total                            BBPS score equals 9. The quality of the bowel                            preparation was good. The ileocecal valve,                            appendiceal orifice, and rectum were photographed. Scope In: 7:44:30 AM Scope Out: 8:08:31 AM Scope Withdrawal Time: 0 hours 15 minutes 10 seconds  Total Procedure Duration: 0 hours 24 minutes 1 second  Findings:      Two sessile polyps were found in the transverse colon and cecum. The       polyps were 2 to 4 mm in size. These polyps were removed with a cold       snare. Resection and  retrieval were complete.      Scattered medium-mouthed and small-mouthed diverticula were found in the       sigmoid colon. Impression:               - Two 2 to 4 mm polyps in the transverse colon and                            in the cecum, removed with a cold snare. Resected                            and retrieved.                           - Diverticulosis in the sigmoid colon. Moderate Sedation:      Not Applicable - Patient had care per Anesthesia. Recommendation:           - Patient has a contact number available for                            emergencies. The signs and symptoms of potential  delayed complications were discussed with the                            patient. Return to normal activities tomorrow.                            Written discharge instructions were provided to the                            patient.                           - Resume previous diet.                           - Continue present medications.                           - Await pathology results.                           - Repeat colonoscopy in 7 years for surveillance.                           - Resume Eliquis .                           - Follow up with Dr. Tova Fresh in one month. Procedure Code(s):        --- Professional ---                           [phone], Colonoscopy, flexible; with removal of                            tumor(s), polyp(s), or other lesion(s) by snare                            technique Diagnosis Code(s):        --- Professional ---                           D50.9, Iron deficiency anemia, unspecified                           D12.3, Benign neoplasm of transverse colon (hepatic                            flexure or splenic flexure)                           D12.0, Benign neoplasm of cecum                           K57.30, Diverticulosis of large intestine without                            perforation or abscess without bleeding CPT copyright 2022  American Medical Association. All rights reserved. The codes documented in this report are preliminary and upon coder review may  be revised to meet current compliance requirements. Alvis Jourdain, MD Alvis Jourdain, MD 09/24/2023 8:19:03 AM This report has been signed electronically. Number of Addenda: 0

## 2023-09-24 NOTE — Anesthesia Postprocedure Evaluation (Signed)
 Anesthesia Post Note  Patient: Brad Davis  Procedure(s) Performed: EGD (ESOPHAGOGASTRODUODENOSCOPY) COLONOSCOPY BIOPSY, SKIN, SUBCUTANEOUS TISSUE, OR MUCOUS MEMBRANE POLYPECTOMY, INTESTINE     Patient location during evaluation: PACU Anesthesia Type: MAC Level of consciousness: awake and alert Pain management: pain level controlled Vital Signs Assessment: post-procedure vital signs reviewed and stable Respiratory status: spontaneous breathing, nonlabored ventilation, respiratory function stable and patient connected to nasal cannula oxygen Cardiovascular status: stable and blood pressure returned to baseline Postop Assessment: no apparent nausea or vomiting Anesthetic complications: no  No notable events documented.  Last Vitals:  Vitals:   09/24/23 0830 09/24/23 0840  BP: 108/67 127/71  Pulse: 69 69  Resp: 14 15  Temp:    SpO2: 96% 98%    Last Pain:  Vitals:   09/24/23 0840  TempSrc:   PainSc: 0-No pain                 Melvenia Stabs

## 2023-09-24 NOTE — Discharge Instructions (Signed)

## 2023-09-24 NOTE — Op Note (Signed)
 [facility] Patient Name: Brad Davis Procedure Date: 09/24/2023 MRN: [id number] Attending MD: Alvis Jourdain , MD, [id number] Date of Birth: 1956/04/19 CSN: [id number] Age: 68 Admit Type: Outpatient Procedure:                Upper GI endoscopy Indications:              Iron deficiency anemia Providers:                Alvis Jourdain, MD, Lonzell Robin, RN, Marvelyn Slim, Technician Referring MD:             Alvis Jourdain, MD Medicines:                Propofol  per Anesthesia Complications:            No immediate complications. Estimated Blood Loss:     Estimated blood loss: none. Procedure:                Pre-Anesthesia Assessment:                           - Prior to the procedure, a History and Physical                            was performed, and patient medications and                            allergies were reviewed. The patient's tolerance of                            previous anesthesia was also reviewed. The risks                            and benefits of the procedure and the sedation                            options and risks were discussed with the patient.                            All questions were answered, and informed consent                            was obtained. Prior Anticoagulants: The patient has                            taken Eliquis  (apixaban ), last dose was 2 days                            prior to procedure. ASA Grade Assessment: III - A                            patient with severe systemic disease. After  reviewing the risks and benefits, the patient was                            deemed in satisfactory condition to undergo the                            procedure.                           - Sedation was administered by an anesthesia                            professional. Deep sedation was attained.                           After obtaining informed consent, the  endoscope was                            passed under direct vision. Throughout the                            procedure, the patient's blood pressure, pulse, and                            oxygen saturations were monitored continuously. The                            PCF-HQ190L ([id number]) Olympus colonoscope was                            introduced through the mouth, and advanced to the                            second part of duodenum. The upper GI endoscopy was                            accomplished without difficulty. The patient                            tolerated the procedure well. Scope In: Scope Out: Findings:      The esophagus was normal.      Patchy mildly erythematous mucosa without bleeding was found in the       gastric antrum. Biopsies were taken with a cold forceps for Helicobacter       pylori testing.      The examined duodenum was normal. Impression:               - Normal esophagus.                           - Erythematous mucosa in the antrum. Biopsied.                           - Normal examined duodenum. Moderate Sedation:      Not Applicable - Patient had care per Anesthesia. Recommendation:           -  Patient has a contact number available for                            emergencies. The signs and symptoms of potential                            delayed complications were discussed with the                            patient. Return to normal activities tomorrow.                            Written discharge instructions were provided to the                            patient.                           - Resume previous diet.                           - Continue present medications.                           - Await pathology results. Procedure Code(s):        --- Professional ---                           [phone], Esophagogastroduodenoscopy, flexible,                            transoral; with biopsy, single or multiple Diagnosis Code(s):        ---  Professional ---                           D50.9, Iron deficiency anemia, unspecified                           K31.89, Other diseases of stomach and duodenum CPT copyright 2022 [employer]. All rights reserved. The codes documented in this report are preliminary and upon coder review may  be revised to meet current compliance requirements. Alvis Jourdain, MD Alvis Jourdain, MD 09/24/2023 8:15:42 AM This report has been signed electronically. Number of Addenda: 0

## 2023-09-27 ENCOUNTER — Encounter (HOSPITAL_COMMUNITY): Payer: Self-pay | Admitting: Gastroenterology

## 2023-09-28 LAB — SURGICAL PATHOLOGY

## 2023-11-17 ENCOUNTER — Encounter (HOSPITAL_COMMUNITY): Admission: RE | Payer: Self-pay | Source: Home / Self Care

## 2023-11-17 ENCOUNTER — Ambulatory Visit (HOSPITAL_COMMUNITY): Admission: RE | Admit: 2023-11-17 | Source: Home / Self Care | Admitting: Gastroenterology

## 2023-11-17 SURGERY — IMAGING PROCEDURE, GI TRACT, INTRALUMINAL, VIA CAPSULE

## 2023-12-14 ENCOUNTER — Ambulatory Visit (HOSPITAL_COMMUNITY)
Admission: RE | Admit: 2023-12-14 | Discharge: 2023-12-14 | Disposition: A | Discharge status: Home / Self Care | Source: Ambulatory Visit | Attending: Cardiology | Admitting: Cardiology

## 2023-12-14 ENCOUNTER — Ambulatory Visit: Payer: Self-pay | Admitting: Cardiology

## 2023-12-14 DIAGNOSIS — I480 Paroxysmal atrial fibrillation: Secondary | ICD-10-CM | POA: Diagnosis not present

## 2023-12-14 DIAGNOSIS — I5020 Unspecified systolic (congestive) heart failure: Secondary | ICD-10-CM | POA: Diagnosis not present

## 2023-12-14 LAB — ECHOCARDIOGRAM COMPLETE
AR max vel: 1.61 cm2
AV Area VTI: 1.72 cm2
AV Area mean vel: 1.63 cm2
AV Mean grad: 13.7 mmHg
AV Peak grad: 26.6 mmHg
Ao pk vel: 2.58 m/s
Calc EF: 51 %
S' Lateral: 3.71 cm
Single Plane A2C EF: 53.1 %
Single Plane A4C EF: 52.5 %

## 2024-01-11 ENCOUNTER — Encounter: Payer: Self-pay | Admitting: Internal Medicine

## 2024-01-11 ENCOUNTER — Ambulatory Visit: Attending: Internal Medicine | Admitting: Internal Medicine

## 2024-01-11 VITALS — BP 130/79 | HR 83 | Resp 16 | Ht 69.0 in | Wt 205.2 lb

## 2024-01-11 DIAGNOSIS — I350 Nonrheumatic aortic (valve) stenosis: Secondary | ICD-10-CM

## 2024-01-11 DIAGNOSIS — I5020 Unspecified systolic (congestive) heart failure: Secondary | ICD-10-CM | POA: Diagnosis not present

## 2024-01-11 DIAGNOSIS — I471 Supraventricular tachycardia, unspecified: Secondary | ICD-10-CM

## 2024-01-11 DIAGNOSIS — I480 Paroxysmal atrial fibrillation: Secondary | ICD-10-CM

## 2024-01-11 DIAGNOSIS — I2510 Atherosclerotic heart disease of native coronary artery without angina pectoris: Secondary | ICD-10-CM

## 2024-01-11 NOTE — Progress Notes (Signed)
 Cardiology Office Note:  .    Date:  01/11/2024  ID:  Brad Davis, DOB 01/20/1956, MRN [id number] PCP: Onita Rush, MD   [facility]Care Providers Cardiologist:  Stanly DELENA Leavens, MD     CC: AF/HF follow up  History of Present Illness: .    Brad Davis is a 68 y.o. male with a history of AF presents for CAC and HF f/u.  Brad Davis is a 68 year old male with paroxysmal atrial fibrillation and heart failure with reduced ejection fraction who presents for follow-up of his cardiac conditions.  He has a history of paroxysmal atrial fibrillation and heart failure with reduced ejection fraction. 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 An echocardiogram in August showed an improved ejection fraction of 45-50%.  He is currently on goal-directed medical therapy, including carvedilol  and losartan . Previously on Jardiance , he discontinued it due to fatigue and feels better off it.  He has nonobstructive coronary arteries and experiences fatigue on beta-blockade. No chest pain, stable breathing, and no recent hematuria, which was previously noted.  He has mild to moderate aortic stenosis and a heart murmur. He is asymptomatic with no significant shortness of breath or swelling.   Discussed the use of AI scribe software for clinical note transcription with the patient, who gave verbal consent to proceed.  Relevant histories: .  Social- his mother is my neighbor, he owns Emergency planning/management officer Mother has a traumatic brain bleed this year I will go see her post ROS: As per HPI.   Physical Exam:    VS:  BP 130/79 (BP Location: Left Arm, Patient Position: Sitting, Cuff Size: Large)   Pulse 83   Resp 16   Ht 5' 9 (1.753 m)   Wt 205 lb 3.2 oz (93.1 kg)   SpO2 96%   BMI 30.30 kg/m    Wt Readings from Last 3 Encounters:  01/11/24 205 lb 3.2 oz (93.1 kg)   09/24/23 200 lb (90.7 kg)  08/24/23 201 lb (91.2 kg)    Gen: No distress, muscular   Neck: No JVD Ears: Bilateral Dempsey Sign Cardiac: No Rubs or Gallops, systolic murmur, iRRR +2 radial pulses Respiratory: Clear to auscultation bilaterally, normal effort, normal  respiratory rate GI: Soft, nontender, non-distended  MS: No edema;  moves all extremities Integument: Skin feels warm Neuro:  At time of evaluation, alert and oriented to person/place/time/situation  Psych: Normal affect, patient feels well   ASSESSMENT AND PLAN: .    Paroxysmal atrial fibrillation with anticoagulation management Currently asymptomatic with no significant AFib burden. Discussion on potential use of Watchman device if anticoagulation becomes problematic due to bleeding or bruising. Risks of Watchman include vascular and cardiac damage, but these are low. Benefit is significant for high bleeding risk, but not currently for him. Best-studied method for reducing stroke risk is remaining on blood thinners unless issues arise. - SR with rare PVCs today - Continue current therapy. - Discussed Watchman device if significant bleeding or bruising occurs. - no SVT   Heart failure with improved ejection fraction Heart failure with reduced ejection fraction, now improved to 45-50%. On goal-directed medical therapy including carvedilol  and losartan . Jardiance  was discontinued due to fatigue. No initiation of MRA therapy as EF is greater than 40%. Slight improvement in heart function. - Continue carvedilol  and losartan . - Discontinue Jardiance  due to fatigue. - Perform echocardiogram in August 2026 to reassess  heart function (AS) - rare PVCs, if needed increase BB; NYHA I currently and euvolemic  Nonobstructive coronary artery disease Nonobstructive coronary artery disease with no reported chest pain or significant symptoms. Current management is well-managed with no need for increased follow-up. - Monitor for any new  symptoms such as chest pain.  Mild to moderate aortic stenosis Mild to moderate aortic stenosis with a heart murmur. No significant symptoms currently. Plan to monitor for changes in symptoms such as increased shortness of breath or chest pain. Discussed potential surgical intervention if severe aortic stenosis develops, including valve repair and left atrial appendage occlusion. - Perform echocardiogram in August 2026 to reassess aortic stenosis. - Monitor for symptoms such as increased shortness of breath or chest pain.  One year f/u unless new issues  Longitudinal care: The evaluation and management services provided today reflect the complexity inherent in caring for this patient, including the ongoing longitudinal relationship and management of multiple chronic conditions and/or the need for care coordination. The visit required a comprehensive assessment and management plan tailored to the patient's unique needs Time was spent addressing not only the acute concerns but also the broader context of the patient's health, including preventive care, chronic disease management, and care coordination as appropriate.  Complex longitudinal is necessary for conditions including: Improved LVEF on GDMT (slightly) AF mgmt, CAD secondary prevention, valve disease f/u     Stanly Leavens, MD FASE [facility] Cardiologist [facility]  [address] [place], [address] [place], [place] [address] [phone]  11:24 AM

## 2024-01-11 NOTE — Patient Instructions (Signed)
 Medication Instructions:  Your physician recommends that you continue on your current medications as directed. Please refer to the Current Medication list given to you today.  *If you need a refill on your cardiac medications before your next appointment, please call your pharmacy*  Lab Work: NONE If you have labs (blood work) drawn today and your tests are completely normal, you will receive your results only by: MyChart Message (if you have MyChart) OR A paper copy in the mail If you have any lab test that is abnormal or we need to change your treatment, we will call you to review the results.  Testing/Procedures: Aug 2026- - Your physician has requested that you have an echocardiogram. Echocardiography is a painless test that uses sound waves to create images of your heart. It provides your doctor with information about the size and shape of your heart and how well your heart's chambers and valves are working. This procedure takes approximately one hour. There are no restrictions for this procedure. Please do NOT wear cologne, perfume, aftershave, or lotions (deodorant is allowed). Please arrive 15 minutes prior to your appointment time.  Please note: We ask at that you not bring children with you during ultrasound (echo/ vascular) testing. Due to room size and safety concerns, children are not allowed in the ultrasound rooms during exams. Our front office staff cannot provide observation of children in our lobby area while testing is being conducted. An adult accompanying a patient to their appointment will only be allowed in the ultrasound room at the discretion of the ultrasound technician under special circumstances. We apologize for any inconvenience.   Follow-Up: At [facility], you and your health needs are our priority.  As part of our continuing mission to provide you with exceptional heart care, our providers are all part of one team.  This team includes your primary  Cardiologist (physician) and Advanced Practice Providers or APPs (Physician Assistants and Nurse Practitioners) who all work together to provide you with the care you need, when you need it.  Your next appointment:   1 year(s)  Provider:   Stanly DELENA Leavens, MD

## 2024-04-23 ENCOUNTER — Other Ambulatory Visit: Payer: Self-pay | Admitting: Internal Medicine

## 2024-05-19 ENCOUNTER — Other Ambulatory Visit: Payer: Self-pay | Admitting: Physician Assistant

## 2024-05-22 NOTE — Telephone Encounter (Signed)
 Lipids done on 08/19/23

## 2024-12-13 ENCOUNTER — Other Ambulatory Visit (HOSPITAL_COMMUNITY)
